# Patient Record
Sex: Female | Born: 2000
Health system: Southern US, Community
[De-identification: ages and names within clinical notes are randomized; demographics above are authoritative.]

## PROBLEM LIST (undated history)

## (undated) ENCOUNTER — Inpatient Hospital Stay (HOSPITAL_COMMUNITY): Payer: Self-pay

## (undated) HISTORY — PX: WISDOM TOOTH EXTRACTION: SHX21

---
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
## 2020-03-30 ENCOUNTER — Other Ambulatory Visit: Payer: Self-pay

## 2020-03-30 DIAGNOSIS — Z30432 Encounter for removal of intrauterine contraceptive device: Secondary | ICD-10-CM | POA: Diagnosis not present

## 2020-03-31 ENCOUNTER — Ambulatory Visit: Payer: BC Managed Care – PPO | Admitting: Nurse Practitioner

## 2020-03-31 ENCOUNTER — Encounter: Payer: Self-pay | Admitting: Nurse Practitioner

## 2020-03-31 VITALS — BP 100/64 | HR 99 | Temp 97.9°F | Ht 68.0 in | Wt 130.0 lb

## 2020-03-31 DIAGNOSIS — M545 Low back pain, unspecified: Secondary | ICD-10-CM | POA: Diagnosis not present

## 2020-03-31 MED ORDER — NAPROXEN 500 MG PO TABS: 500.0000 mg | ORAL_TABLET | Freq: Two times a day (BID) | ORAL | 0 refills | Status: DC

## 2020-03-31 MED ORDER — CYCLOBENZAPRINE HCL 5 MG PO TABS: 5.0000 mg | ORAL_TABLET | Freq: Every day | ORAL | 0 refills | Status: DC

## 2020-03-31 NOTE — Progress Notes (Signed)
Subjective:  Patient ID: Katrina Gibbs, female    DOB: Dec 30, 2000  Age: 19 y.o. MRN: [id number]  CC: Acute Visit (Pt c/o low back pain x1 month but worse in the past week. Pt states when it is hurting bad the only way to relieve it is bending over. )  Back Pain This is a new problem. The current episode started more than 1 month ago. The problem occurs constantly. The problem is unchanged. The pain is present in the lumbar spine and gluteal. The quality of the pain is described as aching and cramping. The pain does not radiate. The pain is worse during the night. The symptoms are aggravated by lying down and twisting. Pertinent negatives include no abdominal pain, bladder incontinence, bowel incontinence, dysuria, fever, leg pain, numbness, paresis, paresthesias, pelvic pain, perianal numbness, tingling, weakness or weight loss. Risk factors include poor posture. She has tried nothing for the symptoms.   Reviewed past Medical, Social and Family history today.  Outpatient Medications Prior to Visit  Medication Sig Dispense Refill  . clindamycin (CLINDAGEL) 1 % gel   2  . spironolactone (ALDACTONE) 25 MG tablet Take 25 mg by mouth daily.    Marland Kitchen tretinoin (RETIN-A) 0.025 % cream   2  . tretinoin (RETIN-A) 0.05 % cream Apply topically.     No facility-administered medications prior to visit.    ROS See HPI  Objective:  BP 100/64 (BP Location: Left Arm, Patient Position: Sitting, Cuff Size: Normal)   Pulse 99   Temp 97.9 F (36.6 C) (Temporal)   Ht 5\' 8"  (1.727 m)   Wt 130 lb (59 kg)   SpO2 100%   BMI 19.77 kg/m   Physical Exam Musculoskeletal:     Thoracic back: Normal.     Lumbar back: Spasms present. No swelling, edema, deformity, tenderness or bony tenderness. Normal range of motion. Negative right straight leg raise test and negative left straight leg raise test.       Back:     Right hip: Normal.     Right upper leg: Normal.     Right lower leg: No edema.     Left lower  leg: No edema.  Skin:    General: Skin is warm and dry.     Findings: No erythema or rash.  Neurological:     Mental Status: She is alert and oriented to person, place, and time.     Assessment & Plan:  This visit occurred during the SARS-CoV-2 public health emergency.  Safety protocols were in place, including screening questions prior to the visit, additional usage of staff PPE, and extensive cleaning of exam room while observing appropriate contact time as indicated for disinfecting solutions.   Palyn was seen today for acute visit.  Diagnoses and all orders for this visit:  Acute right-sided low back pain without sciatica -     naproxen (NAPROSYN) 500 MG tablet; Take 1 tablet (500 mg total) by mouth 2 (two) times daily with a meal. -     cyclobenzaprine (FLEXERIL) 5 MG tablet; Take 1 tablet (5 mg total) by mouth at bedtime.  Alternate between warm (before exercise) and cold compress (after exercise) Perform back exercise once a day x 2week. Call office if no improvement in 2weeks  Problem List Items Addressed This Visit    None    Visit Diagnoses    Acute right-sided low back pain without sciatica    -  Primary   Relevant Medications   naproxen (  NAPROSYN) 500 MG tablet   cyclobenzaprine (FLEXERIL) 5 MG tablet      Follow-up: No follow-ups on file.  Alysia Penna, NP

## 2020-03-31 NOTE — Patient Instructions (Signed)
Alternate between warm (before exercise) and cold compress (after exercise) Perform back exercise once a day x 2week. Call office if no improvement in 2weeks  Back Exercises The following exercises strengthen the muscles that help to support the trunk and back. They also help to keep the lower back flexible. Doing these exercises can help to prevent back pain or lessen existing pain.  If you have back pain or discomfort, try doing these exercises 2-3 times each day or as told by your health care provider.  As your pain improves, do them once each day, but increase the number of times that you repeat the steps for each exercise (do more repetitions).  To prevent the recurrence of back pain, continue to do these exercises once each day or as told by your health care provider. Do exercises exactly as told by your health care provider and adjust them as directed. It is normal to feel mild stretching, pulling, tightness, or discomfort as you do these exercises, but you should stop right away if you feel sudden pain or your pain gets worse. Exercises Single knee to chest Repeat these steps 3-5 times for each leg: 1. Lie on your back on a firm bed or the floor with your legs extended. 2. Bring one knee to your chest. Your other leg should stay extended and in contact with the floor. 3. Hold your knee in place by grabbing your knee or thigh with both hands and hold. 4. Pull on your knee until you feel a gentle stretch in your lower back or buttocks. 5. Hold the stretch for 10-30 seconds. 6. Slowly release and straighten your leg. Pelvic tilt Repeat these steps 5-10 times: 1. Lie on your back on a firm bed or the floor with your legs extended. 2. Bend your knees so they are pointing toward the ceiling and your feet are flat on the floor. 3. Tighten your lower abdominal muscles to press your lower back against the floor. This motion will tilt your pelvis so your tailbone points up toward the ceiling  instead of pointing to your feet or the floor. 4. With gentle tension and even breathing, hold this position for 5-10 seconds. Cat-cow Repeat these steps until your lower back becomes more flexible: 1. Get into a hands-and-knees position on a firm surface. Keep your hands under your shoulders, and keep your knees under your hips. You may place padding under your knees for comfort. 2. Let your head hang down toward your chest. Contract your abdominal muscles and point your tailbone toward the floor so your lower back becomes rounded like the back of a cat. 3. Hold this position for 5 seconds. 4. Slowly lift your head, let your abdominal muscles relax and point your tailbone up toward the ceiling so your back forms a sagging arch like the back of a cow. 5. Hold this position for 5 seconds.  Press-ups Repeat these steps 5-10 times: 1. Lie on your abdomen (face-down) on the floor. 2. Place your palms near your head, about shoulder-width apart. 3. Keeping your back as relaxed as possible and keeping your hips on the floor, slowly straighten your arms to raise the top half of your body and lift your shoulders. Do not use your back muscles to raise your upper torso. You may adjust the placement of your hands to make yourself more comfortable. 4. Hold this position for 5 seconds while you keep your back relaxed. 5. Slowly return to lying flat on the floor.  Katrina Gibbs  Repeat these steps 10 times: 1. Lie on your back on a firm surface. 2. Bend your knees so they are pointing toward the ceiling and your feet are flat on the floor. Your arms should be flat at your sides, next to your body. 3. Tighten your buttocks muscles and lift your buttocks off the floor until your waist is at almost the same height as your knees. You should feel the muscles working in your buttocks and the back of your thighs. If you do not feel these muscles, slide your feet 1-2 inches farther away from your buttocks. 4. Hold this  position for 3-5 seconds. 5. Slowly lower your hips to the starting position, and allow your buttocks muscles to relax completely. If this exercise is too easy, try doing it with your arms crossed over your chest. Abdominal crunches Repeat these steps 5-10 times: 1. Lie on your back on a firm bed or the floor with your legs extended. 2. Bend your knees so they are pointing toward the ceiling and your feet are flat on the floor. 3. Cross your arms over your chest. 4. Tip your chin slightly toward your chest without bending your neck. 5. Tighten your abdominal muscles and slowly raise your trunk (torso) high enough to lift your shoulder blades a tiny bit off the floor. Avoid raising your torso higher than that because it can put too much stress on your low back and does not help to strengthen your abdominal muscles. 6. Slowly return to your starting position. Back lifts Repeat these steps 5-10 times: 1. Lie on your abdomen (face-down) with your arms at your sides, and rest your forehead on the floor. 2. Tighten the muscles in your legs and your buttocks. 3. Slowly lift your chest off the floor while you keep your hips pressed to the floor. Keep the back of your head in line with the curve in your back. Your eyes should be looking at the floor. 4. Hold this position for 3-5 seconds. 5. Slowly return to your starting position. Contact a health care provider if:  Your back pain or discomfort gets much worse when you do an exercise.  Your worsening back pain or discomfort does not lessen within 2 hours after you exercise. If you have any of these problems, stop doing these exercises right away. Do not do them again unless your health care provider says that you can. Get help right away if:  You develop sudden, severe back pain. If this happens, stop doing the exercises right away. Do not do them again unless your health care provider says that you can. This information is not intended to replace  advice given to you by your health care provider. Make sure you discuss any questions you have with your health care provider. Document Revised: 09/19/2018 Document Reviewed: 02/14/2018 Elsevier Patient Education  2020 [employer].

## 2020-04-11 ENCOUNTER — Other Ambulatory Visit: Payer: Self-pay | Admitting: Nurse Practitioner

## 2020-04-11 DIAGNOSIS — M545 Low back pain, unspecified: Secondary | ICD-10-CM

## 2020-04-14 DIAGNOSIS — L700 Acne vulgaris: Secondary | ICD-10-CM | POA: Diagnosis not present

## 2020-04-25 ENCOUNTER — Other Ambulatory Visit: Payer: Self-pay | Admitting: Nurse Practitioner

## 2020-04-25 DIAGNOSIS — M545 Low back pain, unspecified: Secondary | ICD-10-CM

## 2020-04-26 NOTE — Telephone Encounter (Signed)
LVM for patient to return call in regards to medication request.

## 2020-04-26 NOTE — Telephone Encounter (Signed)
Inquire if she is still having back pain? Did she request for naproxen refill?

## 2020-04-27 NOTE — Telephone Encounter (Signed)
Pharmacy states pt requested medication refill through their app. Still unable to reach patient by phone

## 2020-05-04 ENCOUNTER — Encounter (INDEPENDENT_AMBULATORY_CARE_PROVIDER_SITE_OTHER): Payer: Self-pay | Admitting: Student in an Organized Health Care Education/Training Program

## 2020-06-25 ENCOUNTER — Encounter: Payer: Self-pay | Admitting: Nurse Practitioner

## 2020-06-25 ENCOUNTER — Telehealth (INDEPENDENT_AMBULATORY_CARE_PROVIDER_SITE_OTHER): Payer: BC Managed Care – PPO | Admitting: Nurse Practitioner

## 2020-06-25 DIAGNOSIS — F3342 Major depressive disorder, recurrent, in full remission: Secondary | ICD-10-CM | POA: Diagnosis not present

## 2020-06-25 NOTE — Assessment & Plan Note (Signed)
Stable mood without need for counseling sessions and medication.

## 2020-06-25 NOTE — Progress Notes (Signed)
Virtual Visit via Video Note  I connected with@ on 06/25/20 at 10:15 AM EST by a video enabled telemedicine application and verified that I am speaking with the correct person using two identifiers.  Location: Patient:Home Provider: Office Participants: patient and provider  I discussed the limitations of evaluation and management by telemedicine and the availability of in person appointments. I also discussed with the patient that there may be a patient responsible charge related to this service. The patient expressed understanding and agreed to proceed.  [id number] and depression f/up  History of Present Illness: Depression, major, single episode, complete remission (HCC) Stable mood without need for counseling sessions and medication.  Depression screen [facility] 2/9 06/25/2020 12/23/2019 11/18/2019  Decreased Interest 0 0 1  Down, Depressed, Hopeless 0 0 2  PHQ - 2 Score 0 0 3  Altered sleeping 1 0 1  Tired, decreased energy 1 0 2  Change in appetite 0 1 3  Feeling bad or failure about yourself  0 0 3  Trouble concentrating 0 1 1  Moving slowly or fidgety/restless 0 0 0  Suicidal thoughts 0 0 0  PHQ-9 Score 2 2 13   Difficult doing work/chores Not difficult at all Not difficult at all Very difficult   GAD 7 : Generalized Anxiety Score 06/25/2020 12/23/2019 11/18/2019 09/04/2019  Nervous, Anxious, on Edge 0 2 2 1   Control/stop worrying 0 0 3 1  Worry too much - different things 0 1 3 1   Trouble relaxing 0 0 3 1  Restless 0 0 0 0  Easily annoyed or irritable 1 1 2 1   Afraid - awful might happen 0 0 1 0  Total GAD 7 Score 1 4 14 5   Anxiety Difficulty Not difficult at all Not difficult at all Very difficult -   Observations/Objective: Physical Exam Vitals reviewed.  Psychiatric:        Attention and Perception: Attention normal.        Mood and Affect: Mood normal.        Speech: Speech normal.        Behavior: Behavior is cooperative.        Thought Content: Thought content  normal.        Cognition and Memory: Cognition normal.        Judgment: Judgment normal.    Assessment and Plan: Nicosha was seen today for follow-up.  Diagnoses and all orders for this visit:  Recurrent major depressive disorder, in full remission (HCC)   Follow Up Instructions: F/up in 78months for CPE   I discussed the assessment and treatment plan with the patient. The patient was provided an opportunity to ask questions and all were answered. The patient agreed with the plan and demonstrated an understanding of the instructions.   The patient was advised to call back or seek an in-person evaluation if the symptoms worsen or if the condition fails to improve as anticipated.  , NP

## 2020-08-19 ENCOUNTER — Ambulatory Visit (INDEPENDENT_AMBULATORY_CARE_PROVIDER_SITE_OTHER): Payer: BC Managed Care – PPO | Admitting: Sports Medicine

## 2020-08-19 ENCOUNTER — Encounter: Payer: Self-pay | Admitting: Sports Medicine

## 2020-08-19 ENCOUNTER — Other Ambulatory Visit: Payer: Self-pay

## 2020-08-19 DIAGNOSIS — L603 Nail dystrophy: Secondary | ICD-10-CM

## 2020-08-19 DIAGNOSIS — M79674 Pain in right toe(s): Secondary | ICD-10-CM

## 2020-08-19 NOTE — Progress Notes (Signed)
  Subjective: Katrina Gibbs is a 20 y.o. female patient seen today in office for evaluation of right great toenail. Reports that she dropped a table on the toe back in November and has severe bruising denies any pain but does state that there is some discoloration of her nail.  Review of system noncontributory  Patient Active Problem List   Diagnosis Date Noted  . Depression, major, single episode, complete remission (HCC) 09/04/2019  . Preventative health care 02/07/2016  . Migraines 11/03/2015    Current Outpatient Medications on File Prior to Visit  Medication Sig Dispense Refill  . azithromycin (ZITHROMAX) 250 MG tablet Take 250 mg by mouth daily.    . clindamycin (CLINDAGEL) 1 % gel   2  . spironolactone (ALDACTONE) 25 MG tablet Take 25 mg by mouth daily.    Marland Kitchen tretinoin (RETIN-A) 0.025 % cream   2  . tretinoin (RETIN-A) 0.1 % cream Apply topically at bedtime.     No current facility-administered medications on file prior to visit.    No Known Allergies  Objective: Physical Exam  General: Well developed, nourished, no acute distress, awake, alert and oriented x 3  Vascular: Dorsalis pedis artery 2/4 bilateral, Posterior tibial artery 2/4 bilateral, skin temperature warm to warm proximal to distal bilateral lower extremities, no varicosities, pedal hair present bilateral.  Neurological: Gross sensation present via light touch bilateral.   Dermatological: Skin is warm, dry, and supple bilateral, right hallux nail still attached with blood at the proximal nail fold there is no significant surrounding redness warmth swelling or active drainage nail appears to be still well attached with no acute lifting but did advise patient that as her nail continues to grow out the nail may lift and fall off due to the trauma at the proximal nail fold  Musculoskeletal: No symptomatic boney deformities noted bilateral. Muscular strength within normal limits without painon range of motion. No  pain with calf compression bilateral.  Assessment and Plan:  Problem List Items Addressed This Visit   None   Visit Diagnoses    Nail dystrophy    -  Primary   Toe pain, right          -Examined patient.  -Discussed treatment options for nail dystrophy -Advised patient since nail is still well attached to allow dry blood to slowly grow out -Advised patient to closely monitor at this time no acute procedure or x-rays are indicated since patient has no pain to the toe or toenail -Patient to return if fails to continue to improve or sooner if symptoms worsen.  Asencion Islam, DPM

## 2020-10-13 DIAGNOSIS — Z01419 Encounter for gynecological examination (general) (routine) without abnormal findings: Secondary | ICD-10-CM | POA: Diagnosis not present

## 2020-10-13 DIAGNOSIS — Z6820 Body mass index (BMI) 20.0-20.9, adult: Secondary | ICD-10-CM | POA: Diagnosis not present

## 2020-10-13 DIAGNOSIS — Z113 Encounter for screening for infections with a predominantly sexual mode of transmission: Secondary | ICD-10-CM | POA: Diagnosis not present

## 2020-11-16 ENCOUNTER — Encounter: Payer: BC Managed Care – PPO | Admitting: Nurse Practitioner

## 2020-12-23 ENCOUNTER — Other Ambulatory Visit: Payer: Self-pay

## 2020-12-24 ENCOUNTER — Encounter: Payer: Self-pay | Admitting: Nurse Practitioner

## 2020-12-24 ENCOUNTER — Encounter: Payer: BC Managed Care – PPO | Admitting: Nurse Practitioner

## 2020-12-24 ENCOUNTER — Ambulatory Visit (INDEPENDENT_AMBULATORY_CARE_PROVIDER_SITE_OTHER): Payer: BC Managed Care – PPO | Admitting: Nurse Practitioner

## 2020-12-24 VITALS — BP 110/68 | HR 94 | Temp 98.1°F | Ht 68.25 in | Wt 135.2 lb

## 2020-12-24 DIAGNOSIS — Z0184 Encounter for antibody response examination: Secondary | ICD-10-CM

## 2020-12-24 DIAGNOSIS — Z111 Encounter for screening for respiratory tuberculosis: Secondary | ICD-10-CM

## 2020-12-24 DIAGNOSIS — Z23 Encounter for immunization: Secondary | ICD-10-CM

## 2020-12-24 DIAGNOSIS — F324 Major depressive disorder, single episode, in partial remission: Secondary | ICD-10-CM

## 2020-12-24 DIAGNOSIS — Z0000 Encounter for general adult medical examination without abnormal findings: Secondary | ICD-10-CM | POA: Diagnosis not present

## 2020-12-24 LAB — COMPREHENSIVE METABOLIC PANEL
ALT: 11 U/L (ref 0–35)
AST: 15 U/L (ref 0–37)
Albumin: 4.8 g/dL (ref 3.5–5.2)
Alkaline Phosphatase: 51 U/L (ref 39–117)
BUN: 12 mg/dL (ref 6–23)
CO2: 26 mEq/L (ref 19–32)
Calcium: 10 mg/dL (ref 8.4–10.5)
Chloride: 102 mEq/L (ref 96–112)
Creatinine, Ser: 0.75 mg/dL (ref 0.40–1.20)
GFR: 114.82 mL/min (ref >60.00)
Glucose, Bld: 77 mg/dL (ref 70–99)
Potassium: 4.4 mEq/L (ref 3.5–5.1)
Sodium: 137 mEq/L (ref 135–145)
Total Bilirubin: 1 mg/dL (ref 0.2–1.2)
Total Protein: 7.2 g/dL (ref 6.0–8.3)

## 2020-12-24 LAB — CBC
HCT: 43.2 % (ref 36.0–46.0)
Hemoglobin: 14.7 g/dL (ref 12.0–15.0)
MCHC: 33.9 g/dL (ref 30.0–36.0)
MCV: 91.4 fl (ref 78.0–100.0)
Platelets: 403 10*3/uL — ABNORMAL HIGH (ref 150.0–400.0)
RBC: 4.73 Mil/uL (ref 3.87–5.11)
RDW: 13 % (ref 11.5–14.6)
WBC: 10.1 10*3/uL (ref 4.5–10.5)

## 2020-12-24 NOTE — Patient Instructions (Signed)
Go to lab for blood draw  School form completed  Preventive Care 20-20 Years Old, Female Preventive care refers to lifestyle choices and visits with your health care provider that can promote health and wellness. At this stage in your life, you may start seeing a primary care physician instead of a pediatrician. It is important to take responsibility for your health and well-being. Preventive care for young adults includes: A yearly physical exam. This is also called an annual wellness visit. Regular dental and eye exams. Immunizations. Screening for certain conditions. Healthy lifestyle choices, such as: Eating a healthy diet. Getting regular exercise. Not using drugs or products that contain nicotine and tobacco. Limiting alcohol use. What can I expect for my preventive care visit? Physical exam Your health care provider may check your: Height and weight. These may be used to calculate your BMI (body mass index). BMI is a measurement that tells if you are at a healthy weight. Heart rate and blood pressure. Body temperature. Skin for abnormal spots. Counseling Your health care provider may ask you questions about your: Past medical problems. Family's medical history. Alcohol, tobacco, and drug use. Home life and relationship well-being. Access to firearms. Emotional well-being. Diet, exercise, and sleep habits. Sexual activity and sexual health. Method of birth control. Menstrual cycle. Pregnancy history. What immunizations do I need?  Vaccines are usually given at various ages, according to a schedule. Your health care provider will recommend vaccines for you based on your age, medicalhistory, and lifestyle or other factors, such as travel or where you work. What tests do I need? Blood tests Lipid and cholesterol levels. These may be checked every 5 years starting at age 20. Hepatitis C test. Hepatitis B test. Screening Pelvic exam and Pap test. This may be done every 3  years starting at age 20. STD (sexually transmitted disease) testing, if you are at risk. BRCA-related cancer screening. This may be done if you have a family history of breast, ovarian, tubal, or peritoneal cancers. Other tests Tuberculosis skin test. Vision and hearing tests. Skin exam. Breast exam. Talk with your health care provider about your test results, treatment options,and if necessary, the need for more tests. Follow these instructions at home: Eating and drinking  Eat a healthy diet that includes fresh fruits and vegetables, whole grains, lean protein, and low-fat dairy products. Drink enough fluid to keep your urine pale yellow. Do not drink alcohol if: Your health care provider tells you not to drink. You are pregnant, may be pregnant, or are planning to become pregnant. You are under the legal drinking age. In the [place] the legal drinking age is 20. If you drink alcohol: Limit how much you use to 0-1 drink a day. Be aware of how much alcohol is in your drink. In the [place] one drink equals one 12 oz bottle of beer (355 mL), one 5 oz glass of wine (148 mL), or one 1 oz glass of hard liquor (44 mL).  Lifestyle Take daily care of your teeth and gums. Brush your teeth every morning and night with fluoride toothpaste. Floss one time each day. Stay active. Exercise for at least 30 minutes 5 or more days of the week. Do not use any products that contain nicotine or tobacco, such as cigarettes, e-cigarettes, and chewing tobacco. If you need help quitting, ask your health care provider. Do not use drugs. If you are sexually active, practice safe sex. Use a condom or other form of protection to prevent STIs (sexually  transmitted infections). If you do not wish to become pregnant, use a form of birth control. If you plan to become pregnant, see your health care provider for a prepregnancy visit. Find healthy ways to cope with stress, such as: Meditation, yoga, or listening to  music. Journaling. Talking to a trusted person. Spending time with friends and family. Safety Always wear your seat belt while driving or riding in a vehicle. Do not drive: If you have been drinking alcohol. Do not ride with someone who has been drinking. When you are tired or distracted. While texting. Wear a helmet and other protective equipment during sports activities. If you have firearms in your house, make sure you follow all gun safety procedures. Seek help if you have been bullied, physically abused, or sexually abused. Use the Internet responsibly to avoid dangers, such as online bullying and online sex predators. What's next? Go to your health care provider once a year for an annual wellness visit. Ask your health care provider how often you should have your eyes and teeth checked. Stay up to date on all vaccines. This information is not intended to replace advice given to you by your health care provider. Make sure you discuss any questions you have with your healthcare provider. Document Revised: 01/11/2020 Document Reviewed: 05/09/2018 Elsevier Patient Education  2022 [employer].

## 2020-12-24 NOTE — Progress Notes (Signed)
Subjective:    Patient ID: Katrina Gibbs, female    DOB: Aug 17, 2000, 20 y.o.   MRN: [id number]  Patient presents today for CPE and completion of school form  HPI Depression, major, single episode, in partial remission (HCC) Stable mood. Denies need for CBT and/or medication  She need quantiferon test (denies any respiratory symptoms), Hep. B titer (had 3doses of vaccine) and varicella vaccine. 1st varicella vaccine administered 11/01/2001.  Vision:up to date, use of contact lens Dental:up to date Diet:regular Exercise:none Weight:  Wt Readings from Last 3 Encounters:  12/24/20 135 lb 3.2 oz (61.3 kg)  03/31/20 130 lb (59 kg) (55 %, Z= 0.12)*  12/23/19 131 lb 9.6 oz (59.7 kg) (59 %, Z= 0.22)*   * Growth percentiles are based on CDC (Girls, 2-20 Years) data.    Sexual History (orientation,birth control, marital status, STD):declined HPV vaccine and STD screen (education provided about its importance), breast exam completed by GYN. Advised about need for monthly self breast exam  Depression/Suicide:stable mood, denies need for medication and/or therapy sessions. Depression screen [facility] 2/9 12/24/2020 06/25/2020 12/23/2019 11/18/2019 09/04/2019 07/15/2019 12/18/2017  Decreased Interest 0 0 0 1 0 1 0  Down, Depressed, Hopeless 0 0 0 2 0 2 0  PHQ - 2 Score 0 0 0 3 0 3 0  Altered sleeping 2 1 0 1 1 3  -  Tired, decreased energy 2 1 0 2 1 2  -  Change in appetite 2 0 1 3 1 3  -  Feeling bad or failure about yourself  0 0 0 3 0 2 -  Trouble concentrating 1 0 1 1 2 3  -  Moving slowly or fidgety/restless 0 0 0 0 0 0 -  Suicidal thoughts 0 0 0 0 0 2 -  PHQ-9 Score 7 2 2 13 5 18  -  Difficult doing work/chores Somewhat difficult Not difficult at all Not difficult at all Very difficult - - -   Immunizations: (TDAP, Hep C screen, Pneumovax, Influenza, zoster)  Health Maintenance  Topic Date Due   Chlamydia screening  12/24/2021*   HPV Vaccine (1 - 2-dose series) 12/24/2021*   Hepatitis C  Screening: USPSTF Recommendation to screen - Ages 18-79 yo.  12/24/2021*   HIV Screening  12/24/2021*   Flu Shot  12/27/2020   Tetanus Vaccine  12/04/2021   Pneumococcal Vaccination  Aged Out  *Topic was postponed. The date shown is not the original due date.   Fall Risk: Fall Risk  06/25/2020 09/04/2019 07/15/2019 12/18/2017  Falls in the past year? 0 0 0 No  Number falls in past yr: 0 0 - -  Injury with Fall? 0 0 - -   Advanced Directive: Advanced Directives 12/18/2017  Does Patient Have a Medical Advance Directive? No    Medications and allergies reviewed with patient and updated if appropriate.  Patient Active Problem List   Diagnosis Date Noted   Depression, major, single episode, in partial remission (HCC) 09/04/2019   Preventative health care 02/07/2016   Migraines 11/03/2015   Current Outpatient Medications on File Prior to Visit  Medication Sig Dispense Refill   clindamycin (CLINDAGEL) 1 % gel   2   spironolactone (ALDACTONE) 25 MG tablet Take 25 mg by mouth daily.     tretinoin (RETIN-A) 0.1 % cream Apply topically at bedtime.     No current facility-administered medications on file prior to visit.    Past Medical History:  Diagnosis Date   Constipation    Past Surgical  History:  Procedure Laterality Date   WISDOM TOOTH EXTRACTION     Social History   Socioeconomic History   Marital status: Single    Spouse name: Not on file   Number of children: 0   Years of education: 9   Highest education level: Not on file  Occupational History   Occupation: Student  Tobacco Use   Smoking status: Never   Smokeless tobacco: Never  Vaping Use   Vaping Use: Former   Start date: 12/22/2016   Substances: Nicotine  Substance and Sexual Activity   Alcohol use: No   Drug use: No   Sexual activity: Yes    Birth control/protection: I.U.D.  Other Topics Concern   Not on file  Social History Narrative   12th grade taking online classes   Social Determinants of Health    Financial Resource Strain: Not on file  Food Insecurity: Not on file  Transportation Needs: Not on file  Physical Activity: Not on file  Stress: Not on file  Social Connections: Not on file   Family History  Problem Relation Age of Onset   Hyperlipidemia Father    Hypertension Father    Heart disease Maternal Grandmother    Hypertension Maternal Grandmother    Diabetes Maternal Grandmother    Stroke Maternal Grandfather    Hyperlipidemia Paternal Grandmother    Stroke Paternal Grandfather    Hypertension Paternal Grandfather    Irritable bowel syndrome Neg Hx    Celiac disease Neg Hx    Colitis Neg Hx    Crohn's disease Neg Hx    GER disease Neg Hx        Review of Systems  Constitutional:  Negative for fever, malaise/fatigue and weight loss.  HENT:  Negative for congestion and sore throat.   Eyes:        Negative for visual changes  Respiratory:  Negative for cough and shortness of breath.   Cardiovascular:  Negative for chest pain, palpitations and leg swelling.  Gastrointestinal:  Negative for blood in stool, constipation, diarrhea and heartburn.  Genitourinary:  Negative for dysuria, frequency and urgency.  Musculoskeletal:  Negative for falls, joint pain and myalgias.  Skin:  Negative for rash.  Neurological:  Negative for dizziness, sensory change and headaches.  Endo/Heme/Allergies:  Does not bruise/bleed easily.  Psychiatric/Behavioral:  Positive for depression. Negative for hallucinations, memory loss, substance abuse and suicidal ideas. The patient is nervous/anxious. The patient does not have insomnia.    Objective:   Vitals:   12/24/20 1023  BP: 110/68  Pulse: 94  Temp: 98.1 F (36.7 C)  SpO2: 98%   Body mass index is 20.41 kg/m.  Physical Examination:  Physical Exam Constitutional:      General: She is not in acute distress. HENT:     Right Ear: Tympanic membrane, ear canal and external ear normal.     Left Ear: Tympanic membrane, ear  canal and external ear normal.  Eyes:     General: No scleral icterus.    Extraocular Movements: Extraocular movements intact.     Conjunctiva/sclera: Conjunctivae normal.  Cardiovascular:     Rate and Rhythm: Normal rate and regular rhythm.     Heart sounds: Normal heart sounds.  Pulmonary:     Effort: Pulmonary effort is normal. No respiratory distress.     Breath sounds: Normal breath sounds.  Abdominal:     General: Bowel sounds are normal. There is no distension.     Palpations: Abdomen is soft.  Genitourinary:    Comments: Deferred breast and pelvic exam to GYN Musculoskeletal:        General: Normal range of motion.     Cervical back: Normal range of motion and neck supple.  Lymphadenopathy:     Cervical: No cervical adenopathy.  Skin:    General: Skin is warm and dry.  Neurological:     Mental Status: She is alert and oriented to person, place, and time.  Psychiatric:        Mood and Affect: Mood normal.        Behavior: Behavior normal.        Thought Content: Thought content normal.   ASSESSMENT and PLAN: This visit occurred during the SARS-CoV-2 public health emergency.  Safety protocols were in place, including screening questions prior to the visit, additional usage of staff PPE, and extensive cleaning of exam room while observing appropriate contact time as indicated for disinfecting solutions.   Katrina Gibbs was seen today for annual exam.  Diagnoses and all orders for this visit:  Preventative health care -     CBC -     Comprehensive metabolic panel  Immunity to hepatitis B virus demonstrated by serologic test -     Hepatitis B surface antibody,quantitative  Screening-pulmonary TB -     QuantiFERON-TB Gold Plus  Need for varicella vaccine -     Varicella vaccine subcutaneous  Major depressive disorder with single episode, in partial remission (HCC)     Problem List Items Addressed This Visit       Other   Preventative health care - Primary    Relevant Orders   CBC   Comprehensive metabolic panel   Other Visit Diagnoses     Immunity to hepatitis B virus demonstrated by serologic test       Relevant Orders   Hepatitis B surface antibody,quantitative   Screening-pulmonary TB       Relevant Orders   QuantiFERON-TB Gold Plus   Need for varicella vaccine       Relevant Orders   Varicella vaccine subcutaneous (Completed)   Major depressive disorder with single episode, in partial remission (HCC)   (Chronic)          Follow up: No follow-ups on file.  Alysia Penna, NP

## 2020-12-24 NOTE — Assessment & Plan Note (Signed)
Stable mood. Denies need for CBT and/or medication

## 2020-12-28 LAB — QUANTIFERON-TB GOLD PLUS
Mitogen-NIL: >10 IU/mL
NIL: 0.03 IU/mL
QuantiFERON-TB Gold Plus: NEGATIVE
TB1-NIL: <0 IU/mL
TB2-NIL: 0.01 IU/mL

## 2020-12-28 LAB — HEPATITIS B SURFACE ANTIBODY, QUANTITATIVE: Hep B S AB Quant (Post): <5 m[IU]/mL — ABNORMAL LOW (ref >=10)

## 2021-01-12 ENCOUNTER — Other Ambulatory Visit: Payer: Self-pay

## 2021-01-12 ENCOUNTER — Ambulatory Visit (INDEPENDENT_AMBULATORY_CARE_PROVIDER_SITE_OTHER): Payer: BC Managed Care – PPO

## 2021-01-12 DIAGNOSIS — Z9229 Personal history of other drug therapy: Secondary | ICD-10-CM | POA: Diagnosis not present

## 2021-01-12 DIAGNOSIS — Z23 Encounter for immunization: Secondary | ICD-10-CM | POA: Diagnosis not present

## 2021-01-12 NOTE — Progress Notes (Signed)
Per the orders of NP Alysia Penna pt is here for Hep B vaccine first dose of three dose series. Pt is to return in one month for next dose of Hep B

## 2021-02-16 ENCOUNTER — Other Ambulatory Visit: Payer: Self-pay

## 2021-02-16 ENCOUNTER — Ambulatory Visit (INDEPENDENT_AMBULATORY_CARE_PROVIDER_SITE_OTHER): Payer: BC Managed Care – PPO

## 2021-02-16 DIAGNOSIS — Z23 Encounter for immunization: Secondary | ICD-10-CM | POA: Diagnosis not present

## 2021-02-16 DIAGNOSIS — Z0000 Encounter for general adult medical examination without abnormal findings: Secondary | ICD-10-CM

## 2021-02-16 NOTE — Progress Notes (Signed)
After obtaining consent, and per orders of [facility], injection of Hep B #2 given by [place]. Patient instructed to remain in clinic for 20 minutes afterwards, and to report any adverse reaction to me immediately. Also aware to return for 3rd does on or after February 17th 2023.

## 2021-06-10 DIAGNOSIS — J029 Acute pharyngitis, unspecified: Secondary | ICD-10-CM | POA: Diagnosis not present

## 2021-06-10 DIAGNOSIS — J069 Acute upper respiratory infection, unspecified: Secondary | ICD-10-CM | POA: Diagnosis not present

## 2021-07-19 ENCOUNTER — Ambulatory Visit: Payer: BC Managed Care – PPO

## 2021-07-21 ENCOUNTER — Other Ambulatory Visit: Payer: Self-pay

## 2021-07-21 ENCOUNTER — Ambulatory Visit (INDEPENDENT_AMBULATORY_CARE_PROVIDER_SITE_OTHER): Payer: BC Managed Care – PPO

## 2021-07-21 DIAGNOSIS — Z9229 Personal history of other drug therapy: Secondary | ICD-10-CM

## 2021-07-21 DIAGNOSIS — Z23 Encounter for immunization: Secondary | ICD-10-CM

## 2021-07-21 NOTE — Progress Notes (Signed)
Per orders of Alysia Penna, NP, injection of 3rd Hep B vaccine given by Mickle Plumb, cma.  Patient tolerated injection well.

## 2021-11-11 DIAGNOSIS — Z1151 Encounter for screening for human papillomavirus (HPV): Secondary | ICD-10-CM | POA: Diagnosis not present

## 2021-11-11 DIAGNOSIS — Z124 Encounter for screening for malignant neoplasm of cervix: Secondary | ICD-10-CM | POA: Diagnosis not present

## 2021-11-11 DIAGNOSIS — Z01419 Encounter for gynecological examination (general) (routine) without abnormal findings: Secondary | ICD-10-CM | POA: Diagnosis not present

## 2021-11-11 DIAGNOSIS — Z6821 Body mass index (BMI) 21.0-21.9, adult: Secondary | ICD-10-CM | POA: Diagnosis not present

## 2021-11-11 DIAGNOSIS — Z113 Encounter for screening for infections with a predominantly sexual mode of transmission: Secondary | ICD-10-CM | POA: Diagnosis not present

## 2021-11-16 LAB — HM PAP SMEAR

## 2022-02-08 ENCOUNTER — Telehealth: Payer: Self-pay | Admitting: Nurse Practitioner

## 2022-02-08 NOTE — Telephone Encounter (Signed)
Immunization forms have been faxed to number provided

## 2022-02-08 NOTE — Telephone Encounter (Signed)
Pt [phone]  Pt would like her immunization record faxed to her work. Fax # is [phone]

## 2022-04-17 ENCOUNTER — Telehealth: Payer: Self-pay | Admitting: Nurse Practitioner

## 2022-04-17 NOTE — Telephone Encounter (Signed)
FYI:Pt called in trying to schedule an OV. I asked what is going on and she has abd pain, SOB and feeling like she is going to faint. I transferred over to nurse triage.

## 2022-04-18 ENCOUNTER — Ambulatory Visit: Payer: BC Managed Care – PPO | Admitting: Family Medicine

## 2022-04-18 ENCOUNTER — Ambulatory Visit (INDEPENDENT_AMBULATORY_CARE_PROVIDER_SITE_OTHER): Payer: BC Managed Care – PPO

## 2022-04-18 ENCOUNTER — Encounter: Payer: Self-pay | Admitting: Family Medicine

## 2022-04-18 VITALS — BP 124/82 | HR 89 | Temp 97.5°F | Wt 142.4 lb

## 2022-04-18 DIAGNOSIS — R55 Syncope and collapse: Secondary | ICD-10-CM | POA: Diagnosis not present

## 2022-04-18 DIAGNOSIS — R0602 Shortness of breath: Secondary | ICD-10-CM

## 2022-04-18 DIAGNOSIS — R002 Palpitations: Secondary | ICD-10-CM

## 2022-04-18 LAB — COMPREHENSIVE METABOLIC PANEL
ALT: 10 U/L (ref 0–35)
AST: 15 U/L (ref 0–37)
Albumin: 4.7 g/dL (ref 3.5–5.2)
Alkaline Phosphatase: 41 U/L (ref 39–117)
BUN: 13 mg/dL (ref 6–23)
CO2: 29 mEq/L (ref 19–32)
Calcium: 9.5 mg/dL (ref 8.4–10.5)
Chloride: 102 mEq/L (ref 96–112)
Creatinine, Ser: 0.77 mg/dL (ref 0.40–1.20)
GFR: 110.23 mL/min (ref >60.00)
Glucose, Bld: 85 mg/dL (ref 70–99)
Potassium: 4.8 mEq/L (ref 3.5–5.1)
Sodium: 136 mEq/L (ref 135–145)
Total Bilirubin: 0.7 mg/dL (ref 0.2–1.2)
Total Protein: 7.1 g/dL (ref 6.0–8.3)

## 2022-04-18 LAB — CBC WITH DIFFERENTIAL/PLATELET
Basophils Absolute: 0.1 10*3/uL (ref 0.0–0.1)
Basophils Relative: 0.6 % (ref 0.0–3.0)
Eosinophils Absolute: 0.1 10*3/uL (ref 0.0–0.7)
Eosinophils Relative: 0.6 % (ref 0.0–5.0)
HCT: 41.3 % (ref 36.0–46.0)
Hemoglobin: 14.1 g/dL (ref 12.0–15.0)
Lymphocytes Relative: 20.3 % (ref 12.0–46.0)
Lymphs Abs: 2.4 10*3/uL (ref 0.7–4.0)
MCHC: 34.1 g/dL (ref 30.0–36.0)
MCV: 92.9 fl (ref 78.0–100.0)
Monocytes Absolute: 0.4 10*3/uL (ref 0.1–1.0)
Monocytes Relative: 3.7 % (ref 3.0–12.0)
Neutro Abs: 8.7 10*3/uL — ABNORMAL HIGH (ref 1.4–7.7)
Neutrophils Relative %: 74.8 % (ref 43.0–77.0)
Platelets: 413 10*3/uL — ABNORMAL HIGH (ref 150.0–400.0)
RBC: 4.44 Mil/uL (ref 3.87–5.11)
RDW: 12.8 % (ref 11.5–15.5)
WBC: 11.6 10*3/uL — ABNORMAL HIGH (ref 4.0–10.5)

## 2022-04-18 LAB — HEMOGLOBIN A1C: Hgb A1c MFr Bld: 5.2 % (ref 4.6–6.5)

## 2022-04-18 LAB — TSH: TSH: 1.75 u[IU]/mL (ref 0.35–5.50)

## 2022-04-18 LAB — POCT URINE PREGNANCY: Preg Test, Ur: NEGATIVE

## 2022-04-18 NOTE — Patient Instructions (Signed)
We are ordering blood work, imaging, and heart monitor.  Please go to ED, if you get severe chest pain, fainting, shortness of breath, or other worrisome symptoms.

## 2022-04-18 NOTE — Progress Notes (Unsigned)
Initial visit for syncope associated with sob, palpitations and chest pain.  Pt also reports a "vicious cough" after she eats. She noticed this started after she had covid 2 years ago.

## 2022-04-18 NOTE — Progress Notes (Signed)
Assessment/Plan:   Problem List Items Addressed This Visit       Other   Syncope    Associated with dyspnea, chest pain, and palpitations Concern for possible cardiac etiology including arrhythmia or structural heart disease, other causes include anxiety, lung disease, endocrine dysfunction, electrolyte abnormalities, anemia Orthostatics are normal ECG is sinus rhythm without ST changes, or pq interval prolongation If patient is recurrence, recommend patient to emergency department, low threshold to refer to cardiology If work-up negative, patient follow-up with PCP for further evaluation       Relevant Orders   DG Chest 2 View   Comp Met (CMET)   Hemoglobin A1C   CBC w/Diff   TSH   LONG TERM MONITOR (3-14 DAYS)   ECHOCARDIOGRAM COMPLETE   POCT urine pregnancy (Completed)   Other Visit Diagnoses     Shortness of breath    -  Primary   Relevant Orders   EKG 12-Lead (Completed)   DG Chest 2 View   Comp Met (CMET)   Hemoglobin A1C   CBC w/Diff   TSH   LONG TERM MONITOR (3-14 DAYS)   ECHOCARDIOGRAM COMPLETE   POCT urine pregnancy (Completed)   Palpitations       Relevant Orders   EKG 12-Lead (Completed)   DG Chest 2 View   Comp Met (CMET)   Hemoglobin A1C   CBC w/Diff   TSH   LONG TERM MONITOR (3-14 DAYS)   ECHOCARDIOGRAM COMPLETE   POCT urine pregnancy (Completed)          Subjective:  HPI:  Katrina Gibbs is a 21 y.o. female who has Migraines; Preventative health care; Depression, major, single episode, in partial remission ([place]); and Syncope on their problem list..   She  has a past medical history of Constipation..   She presents with chief complaint of Abdominal Pain (Upper mid Abdominal pain x Saturday , sob and feeling faint x months. Passed out in July and October felt faint. ) .    Syncope/Presyncope: Patient complains of syncope and presyncope. Onset was  several years  years ago, with worsening course since that time. Patient describes  the episode as syncopal episode occurred during normal non-stressful ADLsx2 6 months 3s while walking. Presyncopal events unknown trigger and can occur when sitting. Patient also has associated symptoms of  tachycardia/palpitations. The patient denies diarrhea, excessive thirst, headache, heavy menstrual bleeding, and melena. Taking culprit meds?: diuretics  Dyspnea: Patient complains of shortness of breath at rest, with walking .  Symptoms include chest pain, located substernal area, dyspnea on exertion, and occassional cough with eating after eating after having COVID 2 years ago . Symptoms began  11 months ago  months ago, unchanged since that time.  Patient denies edema   and wheezing. Patient has not had recent travel.  Weight has been stable.  Appetite has been unchanged. Symptoms are exacerbated by  unknown trigger . Symptoms are alleviated by rest and deep breathing.   Past Surgical History:  Procedure Laterality Date   WISDOM TOOTH EXTRACTION      Outpatient Medications Prior to Visit  Medication Sig Dispense Refill   clindamycin (CLINDAGEL) 1 % gel   2   spironolactone (ALDACTONE) 25 MG tablet Take 25 mg by mouth daily.     tretinoin (RETIN-A) 0.1 % cream Apply topically at bedtime.     No facility-administered medications prior to visit.    Family History  Problem Relation Age of Onset   Hyperlipidemia Father  Hypertension Father    Heart disease Maternal Grandmother    Hypertension Maternal Grandmother    Diabetes Maternal Grandmother    Stroke Maternal Grandfather    Hyperlipidemia Paternal Grandmother    Stroke Paternal Grandfather    Hypertension Paternal Grandfather    Irritable bowel syndrome Neg Hx    Celiac disease Neg Hx    Colitis Neg Hx    Crohn's disease Neg Hx    GER disease Neg Hx     Social History   Socioeconomic History   Marital status: Single    Spouse name: Not on file   Number of children: 0   Years of education: 9   Highest education  level: Not on file  Occupational History   Occupation: Ship broker  Tobacco Use   Smoking status: Never   Smokeless tobacco: Never  Vaping Use   Vaping Use: Former   Start date: 12/22/2016   Substances: Nicotine  Substance and Sexual Activity   Alcohol use: No   Drug use: No   Sexual activity: Yes    Birth control/protection: I.U.D.  Other Topics Concern   Not on file  Social History Narrative   12th grade taking online classes   Social Determinants of Health   Financial Resource Strain: Not on file  Food Insecurity: Not on file  Transportation Needs: Not on file  Physical Activity: Not on file  Stress: Not on file  Social Connections: Not on file  Intimate Partner Violence: Not on file                                                                                                 Objective:  Physical Exam: BP 124/82 (BP Location: Left Arm, Patient Position: Sitting, Cuff Size: Large)   Pulse 89   Temp (!) 97.5 F (36.4 C) (Temporal)   Wt 142 lb 6.4 oz (64.6 kg)   LMP 03/21/2022   SpO2 98%   BMI 21.49 kg/m    Gen: NAD, resting comfortably CV: RRR with no murmurs appreciated Pulm: NWOB, CTAB with no crackles, wheezes, or rhonchi GI: Normal bowel sounds present. Soft, Nontender, Nondistended. MSK: no edema, cyanosis, or clubbing noted Skin: warm, dry Neuro: grossly normal, moves all extremities Psych: Normal affect and thought content   Orthostatic VS for the past 72 hrs (Last 3 readings):  Orthostatic BP Patient Position BP Location Cuff Size Orthostatic Pulse  04/18/22 0843 118/78 Lying left side Right Arm Large 88  04/18/22 0842 124/80 Standing Right Arm Large 110  04/18/22 0841 122/78 Sitting Right Arm Large 90        Alesia Banda, MD, MS

## 2022-04-18 NOTE — Assessment & Plan Note (Addendum)
Associated with dyspnea, chest pain, and palpitations Concern for possible cardiac etiology including arrhythmia or structural heart disease, other causes include anxiety, lung disease, endocrine dysfunction, electrolyte abnormalities, anemia Orthostatics are normal ECG is sinus rhythm without ST changes, or pq interval prolongation If patient is recurrence, recommend patient to emergency department, low threshold to refer to cardiology If work-up negative, patient follow-up with PCP for further evaluation

## 2022-05-04 ENCOUNTER — Ambulatory Visit: Payer: BC Managed Care – PPO | Attending: Family Medicine

## 2022-05-04 DIAGNOSIS — R0602 Shortness of breath: Secondary | ICD-10-CM

## 2022-05-04 DIAGNOSIS — R55 Syncope and collapse: Secondary | ICD-10-CM

## 2022-05-04 DIAGNOSIS — R002 Palpitations: Secondary | ICD-10-CM

## 2022-05-04 NOTE — Progress Notes (Unsigned)
Enrolled for [employer] to mail a ZIO XT long term holter monitor to the patients address on file.   DOD to read. 

## 2022-05-12 ENCOUNTER — Encounter: Payer: Self-pay | Admitting: Nurse Practitioner

## 2022-05-12 ENCOUNTER — Ambulatory Visit: Payer: BC Managed Care – PPO | Admitting: Nurse Practitioner

## 2022-05-12 VITALS — BP 102/62 | HR 95 | Temp 97.6°F | Ht 68.0 in | Wt 144.6 lb

## 2022-05-12 DIAGNOSIS — R55 Syncope and collapse: Secondary | ICD-10-CM

## 2022-05-12 DIAGNOSIS — R002 Palpitations: Secondary | ICD-10-CM | POA: Diagnosis not present

## 2022-05-12 DIAGNOSIS — R0602 Shortness of breath: Secondary | ICD-10-CM

## 2022-05-12 NOTE — Assessment & Plan Note (Addendum)
Onset at age 21, initially had intermittent feeling faint but not syncopal episode at that time. Associated with cold sweats, SOB, palpitation and dizziness. Unknown trigger. Occurs at random times. In July 2023, she had a syncopal episode (standing with friend talking, felt faint, turned to go sit down), LOC <52min, witnessed by her friend, no seizure activity noted, no post ictal symptoms. She did not seek medical evaluation at the time. Does not skips meals. Maintain adequate oral hydration, at least 64oz per day. No hx of anemia. No Fhx of sudden death.  Pending holter monitor  and echocardiogram report. Positive orthostatic hypotension. Advised to Use knee high compression stocking during the day and off at night. Maintain adequate oral hydration and do not limit sodium intake. Consider discontinuation of spironolactone if normal echocardiogram and holter monitor

## 2022-05-12 NOTE — Progress Notes (Signed)
Established Patient Visit  Patient: Katrina Gibbs   DOB: [birth year]   21 y.o. Female  MRN: [id number] Visit Date: 05/12/2022  Subjective:    Chief Complaint  Patient presents with   Office Visit    C/o syncope & SOB x 7 months  Requesting records for pap   HPI Syncope Onset at age 35, initially had intermittent feeling faint but not syncopal episode at that time. Associated with cold sweats, SOB, palpitation and dizziness. Unknown trigger. Occurs at random times. In July 2023, she had a syncopal episode (standing with friend talking, felt faint, turned to go sit down), LOC <56min, witnessed by her friend, no seizure activity noted, no post ictal symptoms. She did not seek medical evaluation at the time. Does not skips meals. Maintain adequate oral hydration, at least 64oz per day. No hx of anemia. No Fhx of sudden death.  Pending holter monitor  and echocardiogram report. Positive orthostatic hypotension. Advised to Use knee high compression stocking during the day and off at night. Maintain adequate oral hydration and do not limit sodium intake. Consider discontinuation of spironolactone if normal echocardiogram and holter monitor  Reviewed medical, surgical, and social history today  Medications: Outpatient Medications Prior to Visit  Medication Sig   clindamycin (CLINDAGEL) 1 % gel    spironolactone (ALDACTONE) 25 MG tablet Take 25 mg by mouth daily.   tretinoin (RETIN-A) 0.1 % cream Apply topically at bedtime.   No facility-administered medications prior to visit.   Reviewed past medical and social history.   ROS per HPI above  Last CBC Lab Results  Component Value Date   WBC 11.6 (H) 04/18/2022   HGB 14.1 04/18/2022   HCT 41.3 04/18/2022   MCV 92.9 04/18/2022   RDW 12.8 04/18/2022   PLT 413.0 (H) 04/18/2022   Last metabolic panel Lab Results  Component Value Date   GLUCOSE 85 04/18/2022   NA 136 04/18/2022   K 4.8 04/18/2022   CL 102  04/18/2022   CO2 29 04/18/2022   BUN 13 04/18/2022   CREATININE 0.77 04/18/2022   CALCIUM 9.5 04/18/2022   PROT 7.1 04/18/2022   ALBUMIN 4.7 04/18/2022   BILITOT 0.7 04/18/2022   ALKPHOS 41 04/18/2022   AST 15 04/18/2022   ALT 10 04/18/2022   Last thyroid functions Lab Results  Component Value Date   TSH 1.75 04/18/2022        Objective:  BP 102/62 (BP Location: Left Arm, Patient Position: Sitting, Cuff Size: Small)   Pulse 95   Temp 97.6 F (36.4 C) (Temporal)   Ht 5\' 8"  (1.727 m)   Wt 144 lb 9.6 oz (65.6 kg)   LMP 03/21/2022   SpO2 99%   BMI 21.99 kg/m      Physical Exam Vitals reviewed.  Cardiovascular:     Rate and Rhythm: Normal rate and regular rhythm.     Pulses: Normal pulses.     Heart sounds: Normal heart sounds.  Pulmonary:     Effort: Pulmonary effort is normal.     Breath sounds: Normal breath sounds.  Neurological:     Mental Status: She is alert and oriented to person, place, and time.  Psychiatric:        Mood and Affect: Mood normal.        Behavior: Behavior normal.        Thought Content: Thought content normal.  No results found for any visits on 05/12/22.    Assessment & Plan:    Problem List Items Addressed This Visit       Other   Syncope - Primary    Onset at age 29, initially had intermittent feeling faint but not syncopal episode at that time. Associated with cold sweats, SOB, palpitation and dizziness. Unknown trigger. Occurs at random times. In July 2023, she had a syncopal episode (standing with friend talking, felt faint, turned to go sit down), LOC <45min, witnessed by her friend, no seizure activity noted, no post ictal symptoms. She did not seek medical evaluation at the time. Does not skips meals. Maintain adequate oral hydration, at least 64oz per day. No hx of anemia. No Fhx of sudden death.  Pending holter monitor  and echocardiogram report. Positive orthostatic hypotension. Advised to Use knee high compression  stocking during the day and off at night. Maintain adequate oral hydration and do not limit sodium intake. Consider discontinuation of spironolactone if normal echocardiogram and holter monitor      Return if symptoms worsen or fail to improve.     Alysia Penna, NP

## 2022-05-12 NOTE — Patient Instructions (Signed)
Proceed with echocardiogram and long term heart monitor. Use knee high compression stocking during the day and off at night. Maintain adequate oral hydration and do not limit sodium intake.  Postural Orthostatic Tachycardia Syndrome Postural orthostatic tachycardia syndrome (POTS) is a group of symptoms that occur along with an increase in heart rate when a person stands up after lying down. The symptoms include light-headedness or fainting, and they improve when the person lies back down. POTS may be associated with another medical condition, or it may occur on its own. What are the causes? The cause of this condition is not known, but many conditions and diseases are associated with it. What increases the risk? This condition is more likely to develop in: Women 51-36 years old. Women who are pregnant. Women who are in their period (menstruating). People who have certain conditions, such as: Infection from a virus. Diseases that cause the body's defense system (immune system) to attack healthy organs. These are called autoimmune diseases. Losing a lot of red blood cells (anemia). Losing too much water in the body (dehydration). An overactive thyroid (hyperthyroidism). People who take certain medicines. People who have had a major injury. People who have had surgery. What are the signs or symptoms? The most common symptom of this condition is light-headedness when you stand up from a lying or sitting position. Other symptoms may include: Feeling a rapid increase in the heartbeat (tachycardia) within 10 minutes of standing up. Chest pain. Shortness of breath. Breathing that is deeper and faster than normal (hyperventilation). Fainting. Confusion. Trembling. Weakness. Headache. Anxiety. Nausea. Sweating or flushing. Symptoms may be worse in the morning, and they may be relieved by lying down. How is this diagnosed? This condition is diagnosed based on: Your symptoms. Your  medical history. A physical exam. Checking your heart rate when you are lying down and after you stand up. Checking your blood pressure when you go from lying down to standing up. Blood and urine tests to measure hormones that change with blood pressure. The blood tests will be done when you are lying down and when you are standing up. You may have other tests to check for conditions or diseases that are associated with POTS. How is this treated? Treatment for this condition depends on how severe your symptoms are and whether you have any conditions or diseases that are associated with POTS. Treatment may involve: Treating any conditions or diseases that are associated with POTS. Drinking two glasses of water before getting up from a lying position. Increasing salt (sodium) in your diet. Taking medicine to control blood pressure and heart rate (beta-blocker). Avoiding certain medicines. Starting an exercise program under the supervision of a health care provider. Follow these instructions at home: Medicines Take over-the-counter and prescription medicines only as told by your health care provider. Let your health care provider know about all prescription or over-the-counter medicines you take. These include herbs, vitamins, and supplements. You may need to stop or adjust some medicines if they cause this condition. Talk with your health care provider before starting any new medicines. Eating and drinking  Drink enough fluid to keep your urine pale yellow. If told by your health care provider, drink two glasses of water before getting up from a lying position. Follow instructions from your health care provider about how much sodium you should include in your diet. Avoid heavy meals. Eat several small meals a day instead of a few large meals. General instructions Do an aerobic exercise for 20 minutes  a day, at least 3 days a week. Aerobic exercises are those that cause your heart to beat  faster. Ask your health care provider what kinds of exercise are safe for you. Do not use any products that contain nicotine or tobacco. These products include cigarettes, chewing tobacco, and vaping devices, such as e-cigarettes. These can interfere with blood flow. If you need help quitting, ask your health care provider. Keep all follow-up visits. This is important. Contact a health care provider if: Your symptoms do not improve after treatment. Your symptoms get worse. You develop new symptoms. Get help right away if: You have chest pain. You have difficulty breathing. You have fainting episodes. These symptoms may be an emergency. Get help right away. Call 911. Do not wait to see if the symptoms will go away. Do not drive yourself to the hospital. Summary POTS is a group of symptoms that occur along with an increase in heart rate when a person stands up after lying down. The most common symptom is light-headedness when you stand up. Treatment for this condition includes treating any underlying conditions, drinking plenty of water, stopping or changing some medicines, or starting an exercise program. Get help right away if you have chest pain, difficulty breathing, or fainting episodes. These symptoms may be an emergency. This information is not intended to replace advice given to you by your health care provider. Make sure you discuss any questions you have with your health care provider. Document Revised: 11/25/2020 Document Reviewed: 11/25/2020 Elsevier Patient Education  2023 [employer].

## 2022-06-02 ENCOUNTER — Ambulatory Visit (HOSPITAL_COMMUNITY): Payer: BLUE CROSS/BLUE SHIELD | Attending: Family Medicine

## 2022-06-02 DIAGNOSIS — R0602 Shortness of breath: Secondary | ICD-10-CM | POA: Diagnosis not present

## 2022-06-02 DIAGNOSIS — R002 Palpitations: Secondary | ICD-10-CM | POA: Insufficient documentation

## 2022-06-02 DIAGNOSIS — R55 Syncope and collapse: Secondary | ICD-10-CM | POA: Insufficient documentation

## 2022-06-02 LAB — ECHOCARDIOGRAM COMPLETE
Area-P 1/2: 6.32 cm2
S' Lateral: 2.9 cm

## 2022-06-27 ENCOUNTER — Encounter: Payer: Self-pay | Admitting: *Deleted

## 2022-06-27 ENCOUNTER — Other Ambulatory Visit: Payer: Self-pay

## 2022-06-27 ENCOUNTER — Ambulatory Visit
Admission: EM | Admit: 2022-06-27 | Discharge: 2022-06-27 | Disposition: A | Discharge status: Home / Self Care | Payer: BC Managed Care – PPO | Attending: Internal Medicine | Admitting: Internal Medicine

## 2022-06-27 DIAGNOSIS — J029 Acute pharyngitis, unspecified: Secondary | ICD-10-CM | POA: Insufficient documentation

## 2022-06-27 LAB — POCT MONO SCREEN (KUC): Mono, POC: NEGATIVE

## 2022-06-27 LAB — POCT RAPID STREP A (OFFICE): Rapid Strep A Screen: NEGATIVE

## 2022-06-27 MED ORDER — ACETAMINOPHEN 325 MG PO TABS
650.0000 mg | ORAL_TABLET | Freq: Once | ORAL | Status: AC
  Administered 2022-06-27: 650 mg via ORAL

## 2022-06-27 MED ORDER — AMOXICILLIN 500 MG PO CAPS: 500.0000 mg | ORAL_CAPSULE | Freq: Two times a day (BID) | ORAL | 0 refills | Status: AC

## 2022-06-27 NOTE — Discharge Instructions (Signed)
Your rapid strep and rapid monotest negative.  I am still suspicious of strep throat given the appearance of your throat on exam so I am treating this with antibiotic.  Follow-up if any symptoms persist or worsen.

## 2022-06-27 NOTE — ED Triage Notes (Signed)
Pt saw white spots on tonsils this morning.

## 2022-06-27 NOTE — ED Provider Notes (Signed)
[facility]    CSN: [id number] Arrival date & time: 06/27/22  1602      History   Chief Complaint Chief Complaint  Patient presents with   Sore Throat    HPI Katrina Gibbs is a 22 y.o. female.   Patient presents with sore throat and nasal drainage that started today.  Patient reports she does have a known sick contact at work.  Denies any fever at home.  Has taken Advil a few hours prior to arrival to urgent care for symptoms.  Denies any associated cough, chest pain, shortness of breath, nausea, vomiting, diarrhea, abdominal pain.   Sore Throat    Past Medical History:  Diagnosis Date   Constipation     Patient Active Problem List   Diagnosis Date Noted   Syncope 04/18/2022   Preventative health care 02/07/2016   Migraines 11/03/2015    Past Surgical History:  Procedure Laterality Date   WISDOM TOOTH EXTRACTION      OB History   No obstetric history on file.      Home Medications    Prior to Admission medications   Medication Sig Start Date End Date Taking? Authorizing Provider  amoxicillin (AMOXIL) 500 MG capsule Take 1 capsule (500 mg total) by mouth 2 (two) times daily for 10 days. 06/27/22 07/07/22 Yes Garreth Burnsworth, Michele Rockers, FNP  clindamycin (CLINDAGEL) 1 % gel  02/04/18   [provider]  spironolactone (ALDACTONE) 25 MG tablet Take 25 mg by mouth daily.    [provider]  tretinoin (RETIN-A) 0.1 % cream Apply topically at bedtime. 08/05/20   [provider]    Family History Family History  Problem Relation Age of Onset   Hyperlipidemia Father    Hypertension Father    Heart disease Maternal Grandmother    Hypertension Maternal Grandmother    Diabetes Maternal Grandmother    Stroke Maternal Grandfather    Hyperlipidemia Paternal Grandmother    Stroke Paternal Grandfather    Hypertension Paternal Grandfather    Irritable bowel syndrome Neg Hx    Celiac disease Neg Hx    Colitis Neg Hx    Crohn's disease Neg  Hx    GER disease Neg Hx     Social History Social History   Tobacco Use   Smoking status: Never   Smokeless tobacco: Never  Vaping Use   Vaping Use: Former   Start date: 12/22/2016   Substances: Nicotine  Substance Use Topics   Alcohol use: No   Drug use: No     Allergies   Patient has no known allergies.   Review of Systems Review of Systems Per HPI  Physical Exam Triage Vital Signs ED Triage Vitals  Enc Vitals Group     BP 06/27/22 1612 114/74     Pulse Rate 06/27/22 1612 (!) 104     Resp 06/27/22 1612 18     Temp 06/27/22 1612 99.2 F (37.3 C)     Temp src --      SpO2 06/27/22 1612 99 %     Weight --      Height --      Head Circumference --      Peak Flow --      Pain Score 06/27/22 1610 5     Pain Loc --      Pain Edu? --      Excl. in [place]? --    No data found.  Updated Vital Signs BP 114/74  Pulse 99   Temp 98 F (36.7 C) (Oral)   Resp 18   LMP 06/17/2022   SpO2 98%   Visual Acuity Right Eye Distance:   Left Eye Distance:   Bilateral Distance:    Right Eye Near:   Left Eye Near:    Bilateral Near:     Physical Exam Constitutional:      General: She is not in acute distress.    Appearance: Normal appearance. She is not toxic-appearing or diaphoretic.  HENT:     Head: Normocephalic and atraumatic.     Right Ear: Tympanic membrane and ear canal normal.     Left Ear: Tympanic membrane and ear canal normal.     Nose: Congestion present.     Mouth/Throat:     Mouth: Mucous membranes are moist.     Pharynx: Posterior oropharyngeal erythema present. No oropharyngeal exudate.     Tonsils: Tonsillar exudate present. No tonsillar abscesses. 1+ on the right. 1+ on the left.  Eyes:     Extraocular Movements: Extraocular movements intact.     Conjunctiva/sclera: Conjunctivae normal.     Pupils: Pupils are equal, round, and reactive to light.  Cardiovascular:     Rate and Rhythm: Normal rate and regular rhythm.     Pulses: Normal pulses.      Heart sounds: Normal heart sounds.  Pulmonary:     Effort: Pulmonary effort is normal. No respiratory distress.     Breath sounds: Normal breath sounds. No wheezing.  Abdominal:     General: Abdomen is flat. Bowel sounds are normal.     Palpations: Abdomen is soft.  Musculoskeletal:        General: Normal range of motion.     Cervical back: Normal range of motion.  Skin:    General: Skin is warm and dry.  Neurological:     General: No focal deficit present.     Mental Status: She is alert and oriented to person, place, and time. Mental status is at baseline.  Psychiatric:        Mood and Affect: Mood normal.        Behavior: Behavior normal.      UC Treatments / Results  Labs (all labs ordered are listed, but only abnormal results are displayed) Labs Reviewed  CULTURE, GROUP A STREP [facility])  POCT RAPID STREP A (OFFICE)  POCT MONO SCREEN ([facility])    EKG   Radiology No results found.  Procedures Procedures (including critical care time)  Medications Ordered in UC Medications  acetaminophen (TYLENOL) tablet 650 mg (650 mg Oral Given 06/27/22 1628)    Initial Impression / Assessment and Plan / UC Course  I have reviewed the triage vital signs and the nursing notes.  Pertinent labs & imaging results that were available during my care of the patient were reviewed by me and considered in my medical decision making (see chart for details).     Rapid strep is negative but I am still highly suspicious of strep given appearance of posterior pharynx on exam with exudate.  No signs of peritonsillar abscess on exam.  Will treat with amoxicillin.  Throat culture pending.  Rapid mono negative.  Discussed with patient the possibility of viral illness as well.  COVID test pending. Tylenol administered with improvement in heart rate and fever.  Discussed return precautions.  Patient verbalized understanding and was agreeable with plan. Final Clinical Impressions(s) / UC Diagnoses    Final diagnoses:  Sore throat  Discharge Instructions      Your rapid strep and rapid monotest negative.  I am still suspicious of strep throat given the appearance of your throat on exam so I am treating this with antibiotic.  Follow-up if any symptoms persist or worsen.     ED Prescriptions     Medication Sig Dispense Auth. Provider   amoxicillin (AMOXIL) 500 MG capsule Take 1 capsule (500 mg total) by mouth 2 (two) times daily for 10 days. 20 capsule Teodora Medici, [place]      PDMP not reviewed this encounter.   Teodora Medici, [place] 06/27/22 1700

## 2022-06-28 ENCOUNTER — Ambulatory Visit: Payer: BLUE CROSS/BLUE SHIELD | Admitting: Nurse Practitioner

## 2022-06-30 LAB — CULTURE, GROUP A STREP (THRC)

## 2022-07-14 ENCOUNTER — Ambulatory Visit: Payer: BC Managed Care – PPO | Admitting: Family Medicine

## 2022-07-14 ENCOUNTER — Encounter: Payer: Self-pay | Admitting: Family Medicine

## 2022-07-14 VITALS — BP 110/74 | HR 123 | Temp 98.8°F | Wt 146.0 lb

## 2022-07-14 DIAGNOSIS — R059 Cough, unspecified: Secondary | ICD-10-CM | POA: Diagnosis not present

## 2022-07-14 DIAGNOSIS — R0981 Nasal congestion: Secondary | ICD-10-CM | POA: Diagnosis not present

## 2022-07-14 LAB — POCT INFLUENZA A/B
Influenza A, POC: NEGATIVE
Influenza B, POC: NEGATIVE

## 2022-07-14 LAB — POC COVID19 BINAXNOW: SARS Coronavirus 2 Ag: NEGATIVE

## 2022-07-14 MED ORDER — FLUTICASONE PROPIONATE 50 MCG/ACT NA SUSP: 2.0000 | Freq: Every day | NASAL | 6 refills | Status: DC

## 2022-07-14 MED ORDER — BENZONATATE 100 MG PO CAPS: 100.0000 mg | ORAL_CAPSULE | Freq: Two times a day (BID) | ORAL | 0 refills | Status: DC | PRN

## 2022-07-14 NOTE — Patient Instructions (Signed)
Please be sure to drink plenty of fluids.   You may take the following OTC medications to help with symptoms:  For cough, use  cough syrups or other cough suppressants.  For headache, sore throat, fevers, muscle aches, chills, other pain, take ibuprofen or tylenol  For congestion, use nasal sprays, decongestants, or antihistamines  Please follow up if no improvement.   Go to ED if you have severe chest pain, fevers, shortness of breath or other worrisome symptoms.

## 2022-07-14 NOTE — Progress Notes (Unsigned)
Assessment/Plan:   Problem List Items Addressed This Visit   None Visit Diagnoses     Cough, unspecified type    -  Primary   Relevant Orders   POCT Influenza A/B   POC COVID-19 BinaxNow       There are no discontinued medications.    Subjective:  HPI: Encounter date: 07/14/2022  Katrina Gibbs is a 22 y.o. female who has Migraines; Preventative health care; and Syncope on their problem list..   She  has a past medical history of Constipation..   She presents with chief complaint of Cough (Cough, runny nose and congestion x Monday. Has tx with mucinex with no relief. ) .     Past Surgical History:  Procedure Laterality Date   WISDOM TOOTH EXTRACTION      Outpatient Medications Prior to Visit  Medication Sig Dispense Refill   clindamycin (CLINDAGEL) 1 % gel   2   spironolactone (ALDACTONE) 25 MG tablet Take 25 mg by mouth daily.     tretinoin (RETIN-A) 0.1 % cream Apply topically at bedtime.     No facility-administered medications prior to visit.    Family History  Problem Relation Age of Onset   Hyperlipidemia Father    Hypertension Father    Heart disease Maternal Grandmother    Hypertension Maternal Grandmother    Diabetes Maternal Grandmother    Stroke Maternal Grandfather    Hyperlipidemia Paternal Grandmother    Stroke Paternal Grandfather    Hypertension Paternal Grandfather    Irritable bowel syndrome Neg Hx    Celiac disease Neg Hx    Colitis Neg Hx    Crohn's disease Neg Hx    GER disease Neg Hx     Social History   Socioeconomic History   Marital status: Single    Spouse name: Not on file   Number of children: 0   Years of education: 9   Highest education level: Not on file  Occupational History   Occupation: Ship broker  Tobacco Use   Smoking status: Never   Smokeless tobacco: Never  Vaping Use   Vaping Use: Former   Start date: 12/22/2016   Substances: Nicotine  Substance and Sexual Activity   Alcohol use: No   Drug use:  No   Sexual activity: Yes    Birth control/protection: I.U.D.  Other Topics Concern   Not on file  Social History Narrative   12th grade taking online classes   Social Determinants of Health   Financial Resource Strain: Not on file  Food Insecurity: Not on file  Transportation Needs: Not on file  Physical Activity: Not on file  Stress: Not on file  Social Connections: Not on file  Intimate Partner Violence: Not on file                                                                                                 Objective:  Physical Exam: BP 110/74 (BP Location: Left Arm, Patient Position: Sitting, Cuff Size: Large)   Pulse (!) 123   Temp 98.8 F (37.1 C) (Oral)   Wt 146  lb (66.2 kg)   LMP 06/17/2022   SpO2 99%   BMI 22.20 kg/m    ***General: No acute distress. Awake and conversant.  Eyes: Normal conjunctiva, anicteric. Round symmetric pupils.  ENT: Hearing grossly intact. No nasal discharge.  Neck: Neck is supple. No masses or thyromegaly.  Respiratory: Respirations are non-labored. No auditory wheezing.  Skin: Warm. No rashes or ulcers.  Psych: Alert and oriented. Cooperative, Appropriate mood and affect, Normal judgment.  CV: No cyanosis or JVD MSK: Normal ambulation. No clubbing  Neuro: Sensation and CN II-XII grossly normal.   No results found for any visits on 07/14/22.       Alesia Banda, MD, MS

## 2022-07-17 DIAGNOSIS — J019 Acute sinusitis, unspecified: Secondary | ICD-10-CM | POA: Insufficient documentation

## 2022-07-17 DIAGNOSIS — R059 Cough, unspecified: Secondary | ICD-10-CM | POA: Insufficient documentation

## 2022-07-17 HISTORY — DX: Acute sinusitis, unspecified: J01.90

## 2022-07-17 NOTE — Assessment & Plan Note (Signed)
Patient presents with a cough and related symptoms of upper respiratory tract infection.   Differential diagnosis:  Viral Upper Respiratory Infection: Most likely, given the presentation and negative test results for influenza and COVID-19. Allergic Rhinitis: Given current season and symptomatology. Bacterial Upper Respiratory Infection: Less likely due to negative recent strep test and the absence of hallmark symptoms.  Plan:  Advise continuation of over-the-counter medications for symptomatic relief. Prescribe benzonatate (TESSALON) 100 MG capsule: Beneficial for relief of cough. Prescribe fluticasone (FLONASE) 50 MCG/ACT nasal spray: Aids in reduction of nasal inflammation and congestion. Advise patient to monitor symptoms and return if they worsen or do not improve in the next 1-2 weeks. Recommend increased fluid intake, rest, and hand hygiene to aid recovery and minimize spread. Discuss precautionary measures to reduce transmission to others and the continuation of wearing a mask in public for the advised period.

## 2022-07-27 ENCOUNTER — Ambulatory Visit
Admission: RE | Admit: 2022-07-27 | Discharge: 2022-07-27 | Disposition: A | Discharge status: Home / Self Care | Payer: BC Managed Care – PPO | Source: Ambulatory Visit | Attending: Physician Assistant | Admitting: Physician Assistant

## 2022-07-27 VITALS — BP 100/68 | HR 92 | Temp 98.0°F | Resp 12

## 2022-07-27 DIAGNOSIS — J069 Acute upper respiratory infection, unspecified: Secondary | ICD-10-CM | POA: Insufficient documentation

## 2022-07-27 DIAGNOSIS — J029 Acute pharyngitis, unspecified: Secondary | ICD-10-CM | POA: Insufficient documentation

## 2022-07-27 LAB — POCT RAPID STREP A (OFFICE): Rapid Strep A Screen: NEGATIVE

## 2022-07-27 NOTE — ED Triage Notes (Signed)
Pt has been sick x 1 month with flu like symptoms. Pt now has chest congestion and painful swallowing x las t few days

## 2022-07-27 NOTE — Discharge Instructions (Signed)
Advised to increase vitamin C intake to 1000-1500 mg a day to help boost immune system. Advised to take a multivitamin once daily for the next 2 to 3 weeks to help boost the immune system.  Advised to decrease the amount of vaping over the next couple weeks to allow the immune system to improve.  Advised to continue to exercise on a regular basis as tolerated.  Advised to ensure good fluid intake throughout the day, normal nutritional meals, and good sleep hygiene.  Advised follow-up PCP or return to urgent care if symptoms fail to improve.

## 2022-07-27 NOTE — ED Provider Notes (Signed)
[facility]    CSN: [id number] Arrival date & time: 07/27/22  1043      History   Chief Complaint No chief complaint on file.   HPI Katrina Gibbs is a 22 y.o. female.   23 year old female presents with sore throat, upper respiratory congestion.  Patient indicates that for the past month she has been having recurrent upper respiratory infections and intermittent sore throat.  Patient indicates that the infection started 1 month ago, she was having sore throat and congestion.  She was seen and evaluated and had a negative strep test, and monotest.  Patient indicates that she improved however she then became sick again with upper respiratory congestion and she saw her PCP who did a COVID and flu test, which were negative.  Patient indicates that she is currently taking Flonase nasal spray and some Tessalon Perles to control cough.  She indicates that this morning she woke up having a sore throat and painful swallowing with white patches on the back of the throat.  She indicates throughout the past 2 weeks she continues to have upper respiratory congestion, rhinitis, mainly clear production.  She has not had fever, chills, wheezing, shortness of breath or nausea or vomiting.  Patient indicates she is drinking fluids and eating normally.  She indicates that she has not traveled out of the state or out of the country over the past 60 days.  Patient indicates she did have evaluation at the end of December which involved Holter monitor, cardiology evaluation, and chest x-ray which were all normal due to having a syncopal episode.  She indicates that she does vape on a regular basis daily menthol flavored nicotine product.  She denies any weight loss.     Past Medical History:  Diagnosis Date   Constipation     Patient Active Problem List   Diagnosis Date Noted   Cough 07/17/2022   Syncope 04/18/2022   Preventative health care 02/07/2016   Migraines 11/03/2015    Past  Surgical History:  Procedure Laterality Date   WISDOM TOOTH EXTRACTION      OB History   No obstetric history on file.      Home Medications    Prior to Admission medications   Medication Sig Start Date End Date Taking? Authorizing Provider  clindamycin (CLINDAGEL) 1 % gel  02/04/18  Yes [provider]  fluticasone (FLONASE) 50 MCG/ACT nasal spray Place 2 sprays into both nostrils daily. 07/14/22  Yes Bonnita Hollow, MD  spironolactone (ALDACTONE) 25 MG tablet Take 25 mg by mouth daily.   Yes [provider]  tretinoin (RETIN-A) 0.1 % cream Apply topically at bedtime. 08/05/20  Yes [provider]  benzonatate (TESSALON) 100 MG capsule Take 1 capsule (100 mg total) by mouth 2 (two) times daily as needed for cough. 07/14/22   Bonnita Hollow, MD    Family History Family History  Problem Relation Age of Onset   Hyperlipidemia Father    Hypertension Father    Heart disease Maternal Grandmother    Hypertension Maternal Grandmother    Diabetes Maternal Grandmother    Stroke Maternal Grandfather    Hyperlipidemia Paternal Grandmother    Stroke Paternal Grandfather    Hypertension Paternal Grandfather    Irritable bowel syndrome Neg Hx    Celiac disease Neg Hx    Colitis Neg Hx    Crohn's disease Neg Hx    GER disease Neg Hx     Social History Social History  Tobacco Use   Smoking status: Never   Smokeless tobacco: Never  Vaping Use   Vaping Use: Former   Start date: 12/22/2016   Substances: Nicotine  Substance Use Topics   Alcohol use: No   Drug use: No     Allergies   Patient has no known allergies.   Review of Systems Review of Systems  HENT:  Positive for postnasal drip, sinus pressure and sore throat.   Respiratory:  Positive for cough.      Physical Exam Triage Vital Signs ED Triage Vitals  Enc Vitals Group     BP 07/27/22 1052 100/68     Pulse Rate 07/27/22 1052 92     Resp 07/27/22 1052 12     Temp 07/27/22 1052  98 F (36.7 C)     Temp Source 07/27/22 1052 Oral     SpO2 07/27/22 1052 95 %     Weight --      Height --      Head Circumference --      Peak Flow --      Pain Score 07/27/22 1054 3     Pain Loc --      Pain Edu? --      Excl. in [place]? --    No data found.  Updated Vital Signs BP 100/68 (BP Location: Right Arm)   Pulse 92   Temp 98 F (36.7 C) (Oral)   Resp 12   LMP 07/15/2022   SpO2 95%   Visual Acuity Right Eye Distance:   Left Eye Distance:   Bilateral Distance:    Right Eye Near:   Left Eye Near:    Bilateral Near:     Physical Exam Constitutional:      Appearance: Normal appearance.  HENT:     Right Ear: Ear canal normal. Tympanic membrane is injected.     Left Ear: Ear canal normal. Tympanic membrane is injected.     Mouth/Throat:     Mouth: Mucous membranes are moist.     Pharynx: Oropharyngeal exudate (mild mainly left tonsils) and posterior oropharyngeal erythema present.  Cardiovascular:     Rate and Rhythm: Normal rate and regular rhythm.     Heart sounds: Normal heart sounds.  Pulmonary:     Effort: Pulmonary effort is normal.     Breath sounds: Normal breath sounds and air entry. No wheezing, rhonchi or rales.  Lymphadenopathy:     Cervical: Cervical adenopathy (mild bilat anterior adenopathy present) present.  Neurological:     Mental Status: She is alert.      UC Treatments / Results  Labs (all labs ordered are listed, but only abnormal results are displayed) Labs Reviewed  CULTURE, GROUP A STREP [facility])  POCT RAPID STREP A (OFFICE)    EKG   Radiology No results found.  Procedures Procedures (including critical care time)  Medications Ordered in UC Medications - No data to display  Initial Impression / Assessment and Plan / UC Course  I have reviewed the triage vital signs and the nursing notes.  Pertinent labs & imaging results that were available during my care of the patient were reviewed by me and considered in my  medical decision making (see chart for details).    Plan: The diagnosis be treated with the following: 1.  Upper respiratory tract infection: A.  Advised to continue using Flonase nasal spray to help decrease congestion and drainage. B.  Advised to increase vitamin C intake to 1000-1500 mg daily  to help boost immune system. 2.  Sore throat: A.  Advised take ibuprofen or Motrin along with lozenges and gargles to help soothe the throat. B. 3.  Advised to take a multivitamin daily to help boost the immune system.  Advised to decrease vaping as this may affect the respiratory infections and duration. 4.  Advised follow-up PCP or return to urgent care as needed. Final Clinical Impressions(s) / UC Diagnoses   Final diagnoses:  Acute upper respiratory infection  Sore throat     Discharge Instructions      Advised to increase vitamin C intake to 1000-1500 mg a day to help boost immune system. Advised to take a multivitamin once daily for the next 2 to 3 weeks to help boost the immune system.  Advised to decrease the amount of vaping over the next couple weeks to allow the immune system to improve.  Advised to continue to exercise on a regular basis as tolerated.  Advised to ensure good fluid intake throughout the day, normal nutritional meals, and good sleep hygiene.  Advised follow-up PCP or return to urgent care if symptoms fail to improve.    ED Prescriptions   None    PDMP not reviewed this encounter.   Nyoka Lint, PA-C 07/27/22 1144

## 2022-07-29 LAB — CULTURE, GROUP A STREP (THRC)

## 2022-08-17 DIAGNOSIS — J0391 Acute recurrent tonsillitis, unspecified: Secondary | ICD-10-CM | POA: Diagnosis not present

## 2022-10-13 DIAGNOSIS — N898 Other specified noninflammatory disorders of vagina: Secondary | ICD-10-CM | POA: Diagnosis not present

## 2022-10-13 DIAGNOSIS — Z113 Encounter for screening for infections with a predominantly sexual mode of transmission: Secondary | ICD-10-CM | POA: Diagnosis not present

## 2023-03-23 DIAGNOSIS — Z01419 Encounter for gynecological examination (general) (routine) without abnormal findings: Secondary | ICD-10-CM | POA: Diagnosis not present

## 2023-03-23 DIAGNOSIS — R319 Hematuria, unspecified: Secondary | ICD-10-CM | POA: Diagnosis not present

## 2023-03-23 DIAGNOSIS — Z113 Encounter for screening for infections with a predominantly sexual mode of transmission: Secondary | ICD-10-CM | POA: Diagnosis not present

## 2023-03-23 DIAGNOSIS — Z6822 Body mass index (BMI) 22.0-22.9, adult: Secondary | ICD-10-CM | POA: Diagnosis not present

## 2023-05-09 DIAGNOSIS — D2261 Melanocytic nevi of right upper limb, including shoulder: Secondary | ICD-10-CM | POA: Diagnosis not present

## 2023-05-09 DIAGNOSIS — D2262 Melanocytic nevi of left upper limb, including shoulder: Secondary | ICD-10-CM | POA: Diagnosis not present

## 2023-05-09 DIAGNOSIS — D225 Melanocytic nevi of trunk: Secondary | ICD-10-CM | POA: Diagnosis not present

## 2023-05-09 DIAGNOSIS — L700 Acne vulgaris: Secondary | ICD-10-CM | POA: Diagnosis not present

## 2023-06-05 ENCOUNTER — Emergency Department (HOSPITAL_BASED_OUTPATIENT_CLINIC_OR_DEPARTMENT_OTHER)
Admission: EM | Admit: 2023-06-05 | Discharge: 2023-06-06 | Disposition: A | Discharge status: Home / Self Care | Payer: BC Managed Care – PPO

## 2023-06-05 ENCOUNTER — Other Ambulatory Visit: Payer: Self-pay

## 2023-06-05 ENCOUNTER — Encounter (HOSPITAL_BASED_OUTPATIENT_CLINIC_OR_DEPARTMENT_OTHER): Payer: Self-pay | Admitting: Emergency Medicine

## 2023-06-05 ENCOUNTER — Emergency Department (HOSPITAL_BASED_OUTPATIENT_CLINIC_OR_DEPARTMENT_OTHER): Payer: BC Managed Care – PPO

## 2023-06-05 DIAGNOSIS — R1031 Right lower quadrant pain: Secondary | ICD-10-CM | POA: Diagnosis not present

## 2023-06-05 DIAGNOSIS — D72829 Elevated white blood cell count, unspecified: Secondary | ICD-10-CM | POA: Insufficient documentation

## 2023-06-05 LAB — URINALYSIS, ROUTINE W REFLEX MICROSCOPIC
Bilirubin Urine: NEGATIVE
Glucose, UA: NEGATIVE mg/dL
Ketones, ur: 40 mg/dL — AB
Leukocytes,Ua: NEGATIVE
Nitrite: NEGATIVE
Protein, ur: 30 mg/dL — AB
Specific Gravity, Urine: >1.046 — ABNORMAL HIGH (ref 1.005–1.030)
pH: 6.5 (ref 5.0–8.0)

## 2023-06-05 LAB — COMPREHENSIVE METABOLIC PANEL
ALT: 21 U/L (ref 0–44)
AST: 47 U/L — ABNORMAL HIGH (ref 15–41)
Albumin: 5.2 g/dL — ABNORMAL HIGH (ref 3.5–5.0)
Alkaline Phosphatase: 52 U/L (ref 38–126)
Anion gap: 13 (ref 5–15)
BUN: 9 mg/dL (ref 6–20)
CO2: 21 mmol/L — ABNORMAL LOW (ref 22–32)
Calcium: 9.4 mg/dL (ref 8.9–10.3)
Chloride: 101 mmol/L (ref 98–111)
Creatinine, Ser: 0.61 mg/dL (ref 0.44–1.00)
GFR, Estimated: >60 mL/min (ref >60)
Glucose, Bld: 92 mg/dL (ref 70–99)
Potassium: 3.6 mmol/L (ref 3.5–5.1)
Sodium: 135 mmol/L (ref 135–145)
Total Bilirubin: 0.5 mg/dL (ref 0.0–1.2)
Total Protein: 8.1 g/dL (ref 6.5–8.1)

## 2023-06-05 LAB — PREGNANCY, URINE: Preg Test, Ur: NEGATIVE

## 2023-06-05 LAB — LIPASE, BLOOD: Lipase: 16 U/L (ref 11–51)

## 2023-06-05 LAB — CBC
HCT: 40 % (ref 36.0–46.0)
Hemoglobin: 14.2 g/dL (ref 12.0–15.0)
MCH: 31.8 pg (ref 26.0–34.0)
MCHC: 35.5 g/dL (ref 30.0–36.0)
MCV: 89.5 fL (ref 80.0–100.0)
Platelets: 449 10*3/uL — ABNORMAL HIGH (ref 150–400)
RBC: 4.47 MIL/uL (ref 3.87–5.11)
RDW: 12.6 % (ref 11.5–15.5)
WBC: 18.6 10*3/uL — ABNORMAL HIGH (ref 4.0–10.5)
nRBC: 0 % (ref 0.0–0.2)

## 2023-06-05 MED ORDER — IOHEXOL 300 MG/ML  SOLN
100.0000 mL | Freq: Once | INTRAMUSCULAR | Status: AC | PRN
  Administered 2023-06-05: 85 mL via INTRAVENOUS

## 2023-06-05 NOTE — ED Triage Notes (Signed)
 CC/o RLQ pain since yesterday, States pain worse with ambulating and touch. Guarding during triage.

## 2023-06-05 NOTE — ED Notes (Signed)
 ED Provider at bedside.

## 2023-06-05 NOTE — ED Provider Notes (Signed)
 [place] [facility] AT [facility] Provider Note   CSN: [id number] Arrival date & time: 06/05/23  1756     History Chief Complaint  Patient presents with   Abdominal Pain    Katrina Gibbs is a 23 y.o. female.  Patient with out significant past medical history presents to the emergency department with concerns of abdominal pain.  Reports that she has been experiencing right lower quad abdominal pain since yesterday.  Does report that a few days ago she did have some right lower quadrant pain with this.  Resolved.  Right lower quadrant pain is now more persistent and worsened with ambulation and flexion extension of the right lower leg.  Denies any fever, chills, body aches, vomiting or diarrhea.  No urinary symptoms.  No prior history of any abdominal surgeries.   Abdominal Pain      Home Medications Prior to Admission medications   Medication Sig Start Date End Date Taking? Authorizing Provider  benzonatate  (TESSALON ) 100 MG capsule Take 1 capsule (100 mg total) by mouth 2 (two) times daily as needed for cough. 07/14/22   Sebastian Beverley NOVAK, MD  clindamycin (CLINDAGEL) 1 % gel  02/04/18   [provider]  fluticasone  (FLONASE ) 50 MCG/ACT nasal spray Place 2 sprays into both nostrils daily. 07/14/22   Sebastian Beverley NOVAK, MD  spironolactone (ALDACTONE) 25 MG tablet Take 25 mg by mouth daily.    [provider]  tretinoin (RETIN-A) 0.1 % cream Apply topically at bedtime. 08/05/20   [provider]      Allergies    Patient has no known allergies.    Review of Systems   Review of Systems  Gastrointestinal:  Positive for abdominal pain.  All other systems reviewed and are negative.   Physical Exam Updated Vital Signs BP (!) 138/96 (BP Location: Left Arm)   Pulse (!) 107   Temp 97.7 F (36.5 C) (Oral)   Resp 16   Ht 5' 8 (1.727 m)   Wt 62.1 kg   SpO2 99%   BMI 20.83 kg/m  Physical Exam Vitals and nursing note reviewed.   Constitutional:      General: She is not in acute distress.    Appearance: She is well-developed. She is not ill-appearing.  HENT:     Head: Normocephalic and atraumatic.  Eyes:     Conjunctiva/sclera: Conjunctivae normal.  Cardiovascular:     Rate and Rhythm: Normal rate and regular rhythm.     Heart sounds: No murmur heard. Pulmonary:     Effort: Pulmonary effort is normal. No respiratory distress.     Breath sounds: Normal breath sounds.  Abdominal:     Palpations: Abdomen is soft.     Tenderness: There is abdominal tenderness in the right lower quadrant. There is right CVA tenderness and guarding. Positive signs include psoas sign.  Musculoskeletal:        General: No swelling.     Cervical back: Neck supple.  Skin:    General: Skin is warm and dry.     Capillary Refill: Capillary refill takes less than 2 seconds.  Neurological:     Mental Status: She is alert.  Psychiatric:        Mood and Affect: Mood normal.     ED Results / Procedures / Treatments   Labs (all labs ordered are listed, but only abnormal results are displayed) Labs Reviewed  COMPREHENSIVE METABOLIC PANEL - Abnormal; Notable for the following components:  Result Value   CO2 21 (*)    Albumin 5.2 (*)    AST 47 (*)    All other components within normal limits  CBC - Abnormal; Notable for the following components:   WBC 18.6 (*)    Platelets 449 (*)    All other components within normal limits  URINALYSIS, ROUTINE W REFLEX MICROSCOPIC - Abnormal; Notable for the following components:   Color, Urine COLORLESS (*)    Specific Gravity, Urine >1.046 (*)    Hgb urine dipstick SMALL (*)    Ketones, ur 40 (*)    Protein, ur 30 (*)    Bacteria, UA RARE (*)    All other components within normal limits  LIPASE, BLOOD  PREGNANCY, URINE    EKG None  Radiology CT ABDOMEN PELVIS W CONTRAST Result Date: 06/05/2023 CLINICAL DATA:  Right lower quadrant abdominal pain since yesterday. EXAM: CT ABDOMEN  AND PELVIS WITH CONTRAST TECHNIQUE: Multidetector CT imaging of the abdomen and pelvis was performed using the standard protocol following bolus administration of intravenous contrast. RADIATION DOSE REDUCTION: This exam was performed according to the departmental dose-optimization program which includes automated exposure control, adjustment of the mA and/or kV according to patient size and/or use of iterative reconstruction technique. CONTRAST:  85mL OMNIPAQUE  IOHEXOL  300 MG/ML  SOLN COMPARISON:  None Available. FINDINGS: Lower chest: Marked pectus deformity noted. The lungs are clear. No pleural effusions. Hepatobiliary: No focal liver abnormality is seen. No gallstones, gallbladder wall thickening, or biliary dilatation. Pancreas: Unremarkable. No pancreatic ductal dilatation or surrounding inflammatory changes. Spleen: Normal in size without focal abnormality. Adrenals/Urinary Tract: Adrenal glands are unremarkable. Kidneys are normal, without renal calculi, focal lesion, or hydronephrosis. Bladder is unremarkable. Stomach/Bowel: The stomach, duodenum, small and colon are grossly normal without oral contrast. Difficult to identified the appendix. Patient has a low lying cecum deep in the pelvis and there are numerous fluid filled small bowel loops in the pelvis. Could not exclude the possibility of appendicitis and recommend correlation with clinical findings. If there is strong clinical suspicion a repeat CT scan with oral contrast may be helpful further evaluation. Vascular/Lymphatic: The aorta is normal in caliber. No dissection. The branch vessels are patent. The major venous structures are patent. No mesenteric or retroperitoneal mass or adenopathy. Small scattered lymph nodes are noted. Reproductive: The uterus and ovaries are unremarkable. Other: No free air or free fluid. Musculoskeletal: No significant bony findings. IMPRESSION: 1. Difficult to identify the appendix. Patient has a low lying cecum deep  in the pelvis and there are numerous fluid filled small bowel loops in the pelvis. Could not exclude the possibility of appendicitis and recommend correlation with clinical findings. If there is strong clinical suspicion a repeat CT scan with oral contrast may be helpful further evaluation. 2. No other significant abdominal/pelvic findings, mass lesions or adenopathy. Electronically Signed   By: MYRTIS Stammer M.D.   On: 06/05/2023 22:52    Procedures Procedures   Medications Ordered in ED Medications  iohexol  (OMNIPAQUE ) 300 MG/ML solution 100 mL (85 mLs Intravenous Contrast Given 06/05/23 2231)    ED Course/ Medical Decision Making/ A&P                                 Medical Decision Making Amount and/or Complexity of Data Reviewed Labs: ordered. Radiology: ordered.  Risk Prescription drug management.   This patient presents to the ED for concern of abdominal  pain.  Differential diagnosis includes bowel obstruction, UTI, appendicitis, urolithiasis   Lab Tests:  I Ordered, and personally interpreted labs.  The pertinent results include: CBC shows leukocytosis at 18.6, CMP without any significant abnormalities, UA without clear signs of infection but some hemoglobin and bacteria seen but no nitrates or leukocytes, ketonuria also seen, lipase unremarkable, urine pregnancy negative   Imaging Studies ordered:  I ordered imaging studies including CT abdomen pelvis with contrast, CT abdomen pelvis without contrast I independently visualized and interpreted imaging which showed CT of the pelvis with contrast unable to clearly visualize the appendix although there is some dilated small bowel loops which are fluid-filled.  Given inability to rule out appendicitis, recommendation is for repeat evaluation with oral contrast given right lower quadrant tenderness.  CT abdomen pelvis without contrast pending at time of signout. I agree with the radiologist interpretation   Problem List / ED  Course:  Patient without significant history presents the emergency department concerns of abdominal pain.  She reports that her abdominal pain notably worsened yesterday but abdomen present for a few days prior.  States that the abdominal pain had been intermittent in nature but has become consistent since yesterday.  Denies any urinary symptoms such as dysuria, hematuria, increased urinary frequency or urgency.  She denies any recent change in the bowel habits.  No vomiting. On exam, there is notable right lower quadrant tenderness with a positive psoas sign.  Concern at this time for possible appendicitis.  Labs ordered from triage include a CBC with leukocytosis at 18.6.  Will proceed with CT imaging to attempt to rule out appendicitis.  CMP is otherwise unremarkable, lipase negative, urine pregnancy negative, and UA without evidence of clear infection.  Patient reports the pain is rather mild at this time and is a 3 or 4 out of 10 does not feel that she needs any medications.  Advised her to make us  aware if she feels the need for any medications if her pain were to acutely worsen. CT abdomen pelvis is unfortunately unable to clearly distinguish the appendix to rule out appendicitis.  There is some dilation of the small bowel loops in this area that are fluid-filled.  Recommendation is for oral contrast with CT abdomen pelvis to further attempt characterize possible appendicitis given area of right lower quadrant pain. Discussed this finding with patient and her mother and they would prefer to proceed with a repeat CT scan to try to further evaluate for possible appendicitis.  Advised that this is my recommendation given patient's leukocytosis and focal right lower quadrant pain.  Patient still does not feel that she needs any medications for pain at this time.  Otherwise currently not meeting sepsis criteria as patient is afebrile, normotensive, not tachycardic or tachypneic.  12:12AM Care of [facility] [place] transferred to Dr. Geroldine at the end of my shift as the patient will require reassessment once labs/imaging have resulted. Patient presentation, ED course, and plan of care discussed with review of all pertinent labs and imaging. Please see his/her note for further details regarding further ED course and disposition. Plan at time of handoff is await repeat CTAP for dispo. Concern for appendicitis given RLQ pain and leukocytosis. This may be altered or completely changed at the discretion of the oncoming team pending results of further workup.   Final Clinical Impression(s) / ED Diagnoses Final diagnoses:  None    Rx / DC Orders ED Discharge Orders     None  Renn Stille A, PA-C 06/06/23 COR[place]IN[place]    Geroldine Berg, MD 06/06/23 [phone]

## 2023-06-06 ENCOUNTER — Telehealth (HOSPITAL_BASED_OUTPATIENT_CLINIC_OR_DEPARTMENT_OTHER): Payer: Self-pay | Admitting: Emergency Medicine

## 2023-06-06 ENCOUNTER — Ambulatory Visit (HOSPITAL_BASED_OUTPATIENT_CLINIC_OR_DEPARTMENT_OTHER)
Admission: RE | Admit: 2023-06-06 | Discharge: 2023-06-06 | Disposition: A | Discharge status: Home / Self Care | Payer: BC Managed Care – PPO | Source: Ambulatory Visit | Attending: Emergency Medicine | Admitting: Emergency Medicine

## 2023-06-06 ENCOUNTER — Emergency Department (HOSPITAL_BASED_OUTPATIENT_CLINIC_OR_DEPARTMENT_OTHER): Payer: BC Managed Care – PPO

## 2023-06-06 DIAGNOSIS — R1031 Right lower quadrant pain: Secondary | ICD-10-CM

## 2023-06-06 MED ORDER — KETOROLAC TROMETHAMINE 10 MG PO TABS: 10.0000 mg | ORAL_TABLET | Freq: Four times a day (QID) | ORAL | 0 refills | Status: DC | PRN

## 2023-06-06 MED ORDER — HYDROCODONE-ACETAMINOPHEN 5-325 MG PO TABS: 1.0000 | ORAL_TABLET | Freq: Four times a day (QID) | ORAL | 0 refills | Status: DC | PRN

## 2023-06-06 MED ORDER — ONDANSETRON HCL 4 MG/2ML IJ SOLN
4.0000 mg | INTRAMUSCULAR | Status: DC | PRN
  Administered 2023-06-06: 4 mg via INTRAVENOUS
  Filled 2023-06-06: qty 2

## 2023-06-06 MED ORDER — HYDROCODONE-ACETAMINOPHEN 5-325 MG PO TABS
2.0000 | ORAL_TABLET | Freq: Once | ORAL | Status: AC
  Administered 2023-06-06: 2 via ORAL
  Filled 2023-06-06: qty 2

## 2023-06-06 MED ORDER — MORPHINE SULFATE (PF) 4 MG/ML IV SOLN
4.0000 mg | INTRAVENOUS | Status: AC | PRN
  Administered 2023-06-06: 4 mg via INTRAVENOUS
  Filled 2023-06-06: qty 1

## 2023-06-06 MED ORDER — METHOCARBAMOL 500 MG PO TABS: 500.0000 mg | ORAL_TABLET | Freq: Two times a day (BID) | ORAL | 0 refills | Status: DC | PRN

## 2023-06-06 NOTE — Discharge Instructions (Addendum)
 Take ibuprofen  600 mg every 6 hours as needed for pain.  Begin taking hydrocodone  as prescribed as needed for pain not relieved with ibuprofen .  Return tomorrow at the given time for an ultrasound of your ovary.  Follow-up with primary doctor if not improving in the next few days, and return to the ER if you develop worsening pain, high fever, bloody stool, or for other new and concerning symptoms.

## 2023-06-06 NOTE — Telephone Encounter (Cosign Needed)
 Patient being seen in the emergency department for right lower abdominal pain.  Had CT imaging as well as pelvic ultrasound which were negative.  Requesting anti-inflammatory.  Was sent home with Norco but would prefer to not take it if it anti-inflammatory manage his symptoms.

## 2023-06-07 ENCOUNTER — Encounter: Payer: Self-pay | Admitting: Nurse Practitioner

## 2023-06-11 NOTE — Telephone Encounter (Signed)
 Pt has a 06/20/23 with Claris Gower for a hosp f/up.

## 2023-06-14 ENCOUNTER — Inpatient Hospital Stay: Payer: BC Managed Care – PPO | Admitting: Nurse Practitioner

## 2023-06-20 ENCOUNTER — Inpatient Hospital Stay: Payer: BC Managed Care – PPO | Admitting: Nurse Practitioner

## 2023-06-20 ENCOUNTER — Telehealth: Payer: Self-pay

## 2023-06-20 NOTE — Telephone Encounter (Signed)
See 06/20/23 TE,

## 2023-06-20 NOTE — Transitions of Care (Post Inpatient/ED Visit) (Unsigned)
   06/20/2023  Name: Katrina Gibbs MRN: [id number] DOB: Jan 14, 2001  Today's TOC FU Call Status: Today's TOC FU Call Status:: Unsuccessful Call (1st Attempt) Unsuccessful Call (1st Attempt) Date: 06/20/23  Attempted to reach the patient regarding the most recent Inpatient/ED visit.  Follow Up Plan: Additional outreach attempts will be made to reach the patient to complete the Transitions of Care (Post Inpatient/ED visit) call.   Signature Arvil Persons, BSN, Charity fundraiser

## 2023-06-21 NOTE — Transitions of Care (Post Inpatient/ED Visit) (Deleted)
   06/21/2023  Name: Katrina Gibbs MRN: [id number] DOB: April 03, 2001  Today's TOC FU Call Status: Today's TOC FU Call Status:: Successful TOC FU Call Completed Unsuccessful Call (1st Attempt) Date: 06/20/23 [facility] FU Call Complete Date: 06/21/23  Attempted to reach the patient regarding the most recent Inpatient/ED visit.  Follow Up Plan: Additional outreach attempts will be made to reach the patient to complete the Transitions of Care (Post Inpatient/ED visit) call.   Signature AGABLE

## 2023-06-21 NOTE — Transitions of Care (Post Inpatient/ED Visit) (Signed)
   06/21/2023  Name: ATHENE HOLBERT MRN: [id number] DOB: [birth year]  Today's TOC FU Call Status: Today's TOC FU Call Status:: Successful TOC FU Call Completed Unsuccessful Call (1st Attempt) Date: 06/20/23 [facility] FU Call Complete Date: 06/21/23 Patient's Name and Date of Birth confirmed.  Transition Care Management Follow-up Telephone Call Date of Discharge: 06/07/23 Discharge Facility: [facility] Type of Discharge: Emergency Department Reason for ED Visit: Other: (abdominal pain) How have you been since you were released from the hospital?: Better Any questions or concerns?: Yes Patient Questions/Concerns:: CT scan and blood work concerns Patient Questions/Concerns Addressed: Provided Patient Educational Materials  Items Reviewed: Did you receive and understand the discharge instructions provided?: Yes Medications obtained,verified, and reconciled?: Yes (Medications Reviewed) Any new allergies since your discharge?: No Dietary orders reviewed?: No Do you have support at home?: No  Medications Reviewed Today: Medications Reviewed Today     Reviewed by Leroy Kennedy, CMA (Certified Medical Assistant) on 06/21/23 at 1344  Med List Status: <None>   Medication Order Taking? Sig Documenting Provider Last Dose Status Informant  benzonatate (TESSALON) 100 MG capsule [id number] Yes Take 1 capsule (100 mg total) by mouth 2 (two) times daily as needed for cough. Garnette Gunner, MD Taking Active   clindamycin (CLINDAGEL) 1 % gel [id number] Yes  [provider] Taking Active   fluticasone (FLONASE) 50 MCG/ACT nasal spray [id number] Yes Place 2 sprays into both nostrils daily. Garnette Gunner, MD Taking Active   HYDROcodone-acetaminophen [facility]) 5-325 MG tablet [id number] Yes Take 1-2 tablets by mouth every 6 (six) hours as needed. Geoffery Lyons, MD Taking Active   ketorolac (TORADOL) 10 MG tablet [id number] Yes Take 1 tablet (10 mg total) by mouth every 6 (six) hours  as needed. Peter Garter, PA Taking Active   methocarbamol (ROBAXIN) 500 MG tablet [id number] Yes Take 1 tablet (500 mg total) by mouth 2 (two) times daily as needed for muscle spasms. Peter Garter, [place] Taking Active   spironolactone (ALDACTONE) 25 MG tablet [id number] Yes Take 25 mg by mouth daily. [provider] Taking Active   tretinoin (RETIN-A) 0.1 % cream [id number] Yes Apply topically at bedtime. [provider] Taking Active             Home Care and Equipment/Supplies: Were Home Health Services Ordered?: No Any new equipment or medical supplies ordered?: No  Functional Questionnaire: Do you need assistance with bathing/showering or dressing?: No Do you need assistance with meal preparation?: No Do you need assistance with eating?: No Do you have difficulty maintaining continence: No Do you need assistance with getting out of bed/getting out of a chair/moving?: No Do you have difficulty managing or taking your medications?: No  Follow up appointments reviewed: PCP Follow-up appointment confirmed?: Yes Date of PCP follow-up appointment?: 06/29/23 Follow-up Provider: Claris Gower NP Specialist [facility] Follow-up appointment confirmed?: No Reason Specialist Follow-Up Not Confirmed: Appointment Sceduled by [facility] Calling Clinician Do you need transportation to your follow-up appointment?: No Do you understand care options if your condition(s) worsen?: Yes-patient verbalized understanding    SIGNATURE Gaige Sebo RMA

## 2023-06-29 ENCOUNTER — Ambulatory Visit: Payer: BC Managed Care – PPO | Admitting: Nurse Practitioner

## 2023-06-29 ENCOUNTER — Encounter: Payer: Self-pay | Admitting: Nurse Practitioner

## 2023-06-29 DIAGNOSIS — D72829 Elevated white blood cell count, unspecified: Secondary | ICD-10-CM

## 2023-06-29 DIAGNOSIS — J0140 Acute pansinusitis, unspecified: Secondary | ICD-10-CM

## 2023-06-29 LAB — CBC WITH DIFFERENTIAL/PLATELET
Basophils Absolute: 0.1 10*3/uL (ref 0.0–0.1)
Basophils Relative: 0.5 % (ref 0.0–3.0)
Eosinophils Absolute: 0.3 10*3/uL (ref 0.0–0.7)
Eosinophils Relative: 2.4 % (ref 0.0–5.0)
HCT: 41.5 % (ref 36.0–46.0)
Hemoglobin: 14 g/dL (ref 12.0–15.0)
Lymphocytes Relative: 19.5 % (ref 12.0–46.0)
Lymphs Abs: 2.2 10*3/uL (ref 0.7–4.0)
MCHC: 33.8 g/dL (ref 30.0–36.0)
MCV: 93.3 fL (ref 78.0–100.0)
Monocytes Absolute: 0.9 10*3/uL (ref 0.1–1.0)
Monocytes Relative: 8.1 % (ref 3.0–12.0)
Neutro Abs: 7.7 10*3/uL (ref 1.4–7.7)
Neutrophils Relative %: 69.5 % (ref 43.0–77.0)
Platelets: 419 10*3/uL — ABNORMAL HIGH (ref 150.0–400.0)
RBC: 4.45 Mil/uL (ref 3.87–5.11)
RDW: 12.9 % (ref 11.5–15.5)
WBC: 11.1 10*3/uL — ABNORMAL HIGH (ref 4.0–10.5)

## 2023-06-29 MED ORDER — AMOXICILLIN-POT CLAVULANATE 875-125 MG PO TABS: 1.0000 | ORAL_TABLET | Freq: Two times a day (BID) | ORAL | 0 refills | Status: DC

## 2023-06-29 MED ORDER — BENZONATATE 100 MG PO CAPS: 100.0000 mg | ORAL_CAPSULE | Freq: Two times a day (BID) | ORAL | 0 refills | Status: DC | PRN

## 2023-06-29 NOTE — Progress Notes (Signed)
Established Patient Visit  Patient: Katrina Gibbs   DOB: [birth year]   23 y.o. Female  MRN: [id number] Visit Date: 06/29/2023  Subjective:    Chief Complaint  Patient presents with   Hospitalization Follow-up    Follow up for abdominal pain; symptoms have improved; she is experiencing cough for 3 weeks, mucinex DM and Tessalon used.    HPI Acute sinusitis Sent augmentin x 7days Use claritin or zyrtec or allegra 1tab daily for postnasal congestion and sinus congestion. Stop oral decongestant Ms. Sabia reports resolved RLQ Abd pain since ED visit on 06/05/2023. We reviewed lab results and radiology reports. Need to repeat CBC due to elevated WBC and platelet count Today she also reports Cough and sinus congestion x 2weeks. Symptoms had improved, then returned despite use of mucinex D.  Reviewed medical, surgical, and social history today  Medications: Outpatient Medications Prior to Visit  Medication Sig   spironolactone (ALDACTONE) 25 MG tablet Take 25 mg by mouth daily.   tretinoin (RETIN-A) 0.1 % cream Apply topically at bedtime.   clindamycin (CLINDAGEL) 1 % gel  (Patient not taking: Reported on 06/29/2023)   fluticasone (FLONASE) 50 MCG/ACT nasal spray Place 2 sprays into both nostrils daily. (Patient not taking: Reported on 06/29/2023)   HYDROcodone-acetaminophen (NORCO) 5-325 MG tablet Take 1-2 tablets by mouth every 6 (six) hours as needed. (Patient not taking: Reported on 06/29/2023)   ketorolac (TORADOL) 10 MG tablet Take 1 tablet (10 mg total) by mouth every 6 (six) hours as needed. (Patient not taking: Reported on 06/29/2023)   methocarbamol (ROBAXIN) 500 MG tablet Take 1 tablet (500 mg total) by mouth 2 (two) times daily as needed for muscle spasms. (Patient not taking: Reported on 06/29/2023)   [DISCONTINUED] benzonatate (TESSALON) 100 MG capsule Take 1 capsule (100 mg total) by mouth 2 (two) times daily as needed for cough. (Patient not taking: Reported on  06/29/2023)   No facility-administered medications prior to visit.   Reviewed past medical and social history.   ROS per HPI above  Last CBC Lab Results  Component Value Date   WBC 18.6 (H) 06/05/2023   HGB 14.2 06/05/2023   HCT 40.0 06/05/2023   MCV 89.5 06/05/2023   MCH 31.8 06/05/2023   RDW 12.6 06/05/2023   PLT 449 (H) 06/05/2023   Last metabolic panel Lab Results  Component Value Date   GLUCOSE 92 06/05/2023   NA 135 06/05/2023   K 3.6 06/05/2023   CL 101 06/05/2023   CO2 21 (L) 06/05/2023   BUN 9 06/05/2023   CREATININE 0.61 06/05/2023   GFRNONAA >60 06/05/2023   CALCIUM 9.4 06/05/2023   PROT 8.1 06/05/2023   ALBUMIN 5.2 (H) 06/05/2023   BILITOT 0.5 06/05/2023   ALKPHOS 52 06/05/2023   AST 47 (H) 06/05/2023   ALT 21 06/05/2023   ANIONGAP 13 06/05/2023        Objective:  BP 119/77 (BP Location: Left Arm, Patient Position: Sitting, Cuff Size: Normal)   Pulse (!) 120   Temp (!) 97 F (36.1 C) (Temporal)   Resp 18   Wt 139 lb 3.2 oz (63.1 kg)   LMP 06/14/2023 (Exact Date)   SpO2 99%   BMI 21.17 kg/m      Physical Exam Vitals and nursing note reviewed.  HENT:     Right Ear: Tympanic membrane, ear canal and external ear normal.     Left Ear:  Tympanic membrane, ear canal and external ear normal.     Nose:     Right Nostril: No occlusion.     Left Nostril: No occlusion.     Right Turbinates: Swollen.     Left Turbinates: Swollen.     Right Sinus: Maxillary sinus tenderness and frontal sinus tenderness present.     Left Sinus: Maxillary sinus tenderness and frontal sinus tenderness present.     Mouth/Throat:     Pharynx: Posterior oropharyngeal erythema and postnasal drip present. No pharyngeal swelling, oropharyngeal exudate or uvula swelling.     Tonsils: No tonsillar exudate.  Cardiovascular:     Rate and Rhythm: Normal rate and regular rhythm.     Pulses: Normal pulses.     Heart sounds: Normal heart sounds.  Pulmonary:     Effort: Pulmonary  effort is normal.     Breath sounds: Normal breath sounds.  Abdominal:     General: Bowel sounds are normal. There is no distension.     Palpations: Abdomen is soft.     Tenderness: There is no abdominal tenderness.  Neurological:     Mental Status: She is alert and oriented to person, place, and time.     No results found for any visits on 06/29/23.    Assessment & Plan:    Problem List Items Addressed This Visit     Acute sinusitis   Sent augmentin x 7days Use claritin or zyrtec or allegra 1tab daily for postnasal congestion and sinus congestion. Stop oral decongestant      Relevant Medications   benzonatate (TESSALON) 100 MG capsule   amoxicillin-clavulanate (AUGMENTIN) 875-125 MG tablet   Other Visit Diagnoses       Leukocytosis, unspecified type       Relevant Orders   CBC with Differential/Platelet      Return in about 3 months (around 09/26/2023) for CPE (fasting).     Alysia Penna, NP

## 2023-06-29 NOTE — Patient Instructions (Signed)
Go to lab Use claritin or zyrtec or allegra 1tab daily for postnasal congestion and sinus congestion.

## 2023-06-29 NOTE — Assessment & Plan Note (Signed)
Sent augmentin x 7days Use claritin or zyrtec or allegra 1tab daily for postnasal congestion and sinus congestion. Stop oral decongestant

## 2023-09-28 ENCOUNTER — Ambulatory Visit (INDEPENDENT_AMBULATORY_CARE_PROVIDER_SITE_OTHER): Payer: BC Managed Care – PPO | Admitting: Nurse Practitioner

## 2023-09-28 ENCOUNTER — Encounter: Payer: Self-pay | Admitting: Nurse Practitioner

## 2023-09-28 VITALS — BP 104/80 | HR 88 | Temp 98.2°F | Ht 68.5 in | Wt 136.0 lb

## 2023-09-28 DIAGNOSIS — G43109 Migraine with aura, not intractable, without status migrainosus: Secondary | ICD-10-CM | POA: Diagnosis not present

## 2023-09-28 DIAGNOSIS — Z0001 Encounter for general adult medical examination with abnormal findings: Secondary | ICD-10-CM

## 2023-09-28 DIAGNOSIS — D72829 Elevated white blood cell count, unspecified: Secondary | ICD-10-CM | POA: Insufficient documentation

## 2023-09-28 DIAGNOSIS — F17200 Nicotine dependence, unspecified, uncomplicated: Secondary | ICD-10-CM | POA: Diagnosis not present

## 2023-09-28 MED ORDER — NICOTINE 21 MG/24HR TD PT24: 21.0000 mg | MEDICATED_PATCH | Freq: Every day | TRANSDERMAL | 0 refills | Status: DC

## 2023-09-28 NOTE — Assessment & Plan Note (Addendum)
 Onset at age 23 50mg /ml cartridge per week. Never quit Unknown trigger Wakes up in middle of night to use vape.  Ready to quit. 21mg  Nicoderm patch sent Advised to also use peppermint candy for oral gratification. She is to call office for additional 17mg  Nicoderm patch in 28days

## 2023-09-28 NOTE — Patient Instructions (Signed)
 Schedule fasting lab appt. Need to be fasting 8hrs prior to blood draw. Ok to drink water. Maintain Heart healthy diet and daily exercise.

## 2023-09-28 NOTE — Progress Notes (Signed)
 Complete physical exam  Patient: Katrina Gibbs   DOB: 01-Jan-2001   22 y.o. Female  MRN: [id number] Visit Date: 09/28/2023  Subjective:    Chief Complaint  Patient presents with   Annual Exam    Not fasting    Katrina Gibbs is a 23 y.o. female who presents today for a complete physical exam. She reports consuming a general diet.  No exercise regimen  She generally feels well. She reports sleeping well. She does have additional problems to discuss today.  Vision:Yes Dental:Yes STD Screen:No  BP Readings from Last 3 Encounters:  09/28/23 104/80  06/29/23 119/77  06/06/23 118/82   Wt Readings from Last 3 Encounters:  09/28/23 136 lb (61.7 kg)  06/29/23 139 lb 3.2 oz (63.1 kg)  06/05/23 137 lb (62.1 kg)   Most recent fall risk assessment:    09/28/2023    9:58 AM  Fall Risk   Falls in the past year? 0  Number falls in past yr: 0  Injury with Fall? 0  Risk for fall due to : No Fall Risks   Depression screen:Yes - No Depression Most recent depression screenings:    09/28/2023    9:59 AM 06/29/2023   11:11 AM  PHQ 2/9 Scores  PHQ - 2 Score 0 0  PHQ- 9 Score 3     HPI  Migraines Controlled by avoiding triggers: chocolate, peanut, banana, red-40 food dye, caffeine Associated to aura-tunnel vision, vomiting with headache. Typically last for 4hrs Resolves with ibuprofen  and rest Fhx of migraine-mother and sister  Vaping nicotine dependence, non-tobacco product Onset at age 57 50mg /ml cartridge per week. Never quit Unknown trigger Wakes up in middle of night to use vape.  Ready to quit. 21mg  Nicoderm patch sent Advised to also use peppermint candy for oral gratification. She is to call office for additional 17mg  Nicoderm patch in 28days   Past Medical History:  Diagnosis Date   Acute sinusitis 07/17/2022   Constipation    Preventative health care 02/07/2016   Past Surgical History:  Procedure Laterality Date   WISDOM TOOTH EXTRACTION     Social History    Socioeconomic History   Marital status: Single    Spouse name: Not on file   Number of children: 0   Years of education: 9   Highest education level: Some college, no degree  Occupational History   Occupation: Consulting civil engineer  Tobacco Use   Smoking status: Never   Smokeless tobacco: Never  Vaping Use   Vaping status: Every Day   Start date: 12/22/2016   Substances: Nicotine, Flavoring  Substance and Sexual Activity   Alcohol use: Yes    Alcohol/week: 1.0 standard drink of alcohol    Types: 1 Glasses of wine per week   Drug use: No   Sexual activity: Not Currently    Birth control/protection: Abstinence  Other Topics Concern   Not on file  Social History Narrative      Social Drivers of Health   Financial Resource Strain: High Risk (06/28/2023)   Overall Financial Resource Strain (CARDIA)    Difficulty of Paying Living Expenses: Very hard  Food Insecurity: No Food Insecurity (06/28/2023)   Hunger Vital Sign    Worried About Running Out of Food in the Last Year: Never true    Ran Out of Food in the Last Year: Never true  Transportation Needs: No Transportation Needs (06/28/2023)   PRAPARE - Administrator, Civil Service (Medical): No  Lack of Transportation (Non-Medical): No  Physical Activity: Sufficiently Active (06/28/2023)   Exercise Vital Sign    Days of Exercise per Week: 4 days    Minutes of Exercise per Session: 60 min  Stress: No Stress Concern Present (06/28/2023)   [employer] of Occupational Health - Occupational Stress Questionnaire    Feeling of Stress : Only a little  Social Connections: Moderately Integrated (06/28/2023)   Social Connection and Isolation Panel [NHANES]    Frequency of Communication with Friends and Family: More than three times a week    Frequency of Social Gatherings with Friends and Family: More than three times a week    Attends Religious Services: More than 4 times per year    Active Member of Clubs or Organizations: Yes     Attends Banker Meetings: 1 to 4 times per year    Marital Status: Never married  Catering manager Violence: Not on file   Family Status  Relation Name Status   Mother  Alive   Father  Alive   Sister  Alive   MGM  Alive   MGF  Deceased   PGM  Alive   PGF  Deceased   Neg Hx  (Not Specified)  No partnership data on file   Family History  Problem Relation Age of Onset   Diabetes Father 30   Hyperlipidemia Father    Hypertension Father    Kidney Stones Father    Heart disease Maternal Grandmother    Hypertension Maternal Grandmother    Diabetes Maternal Grandmother    Stroke Maternal Grandfather    Hyperlipidemia Paternal Grandmother    Stroke Paternal Grandfather    Hypertension Paternal Grandfather    Irritable bowel syndrome Neg Hx    Celiac disease Neg Hx    Colitis Neg Hx    Crohn's disease Neg Hx    GER disease Neg Hx    Allergies  Allergen Reactions   Dextromethorphan Itching    Patient Care Team: Alberto Pina, Connye Delaine, NP as PCP - General (Internal Medicine) Mir, Loyola Rummage, MD as Consulting Physician (Pediatric Gastroenterology) Ob/Gyn, Alexander Iba, MD as Consulting Physician (Obstetrics and Gynecology)   Medications: Outpatient Medications Prior to Visit  Medication Sig   spironolactone (ALDACTONE) 25 MG tablet Take 25 mg by mouth daily.   tretinoin (RETIN-A) 0.1 % cream Apply topically at bedtime.   [DISCONTINUED] amoxicillin -clavulanate (AUGMENTIN ) 875-125 MG tablet Take 1 tablet by mouth 2 (two) times daily. (Patient not taking: Reported on 09/28/2023)   [DISCONTINUED] benzonatate  (TESSALON ) 100 MG capsule Take 1 capsule (100 mg total) by mouth 2 (two) times daily as needed for cough. (Patient not taking: Reported on 09/28/2023)   [DISCONTINUED] clindamycin (CLINDAGEL) 1 % gel  (Patient not taking: Reported on 09/28/2023)   [DISCONTINUED] fluticasone  (FLONASE ) 50 MCG/ACT nasal spray Place 2 sprays into both nostrils daily.  (Patient not taking: Reported on 09/28/2023)   [DISCONTINUED] HYDROcodone -acetaminophen  (NORCO) 5-325 MG tablet Take 1-2 tablets by mouth every 6 (six) hours as needed. (Patient not taking: Reported on 09/28/2023)   [DISCONTINUED] ketorolac  (TORADOL ) 10 MG tablet Take 1 tablet (10 mg total) by mouth every 6 (six) hours as needed. (Patient not taking: Reported on 09/28/2023)   [DISCONTINUED] methocarbamol  (ROBAXIN ) 500 MG tablet Take 1 tablet (500 mg total) by mouth 2 (two) times daily as needed for muscle spasms. (Patient not taking: Reported on 09/28/2023)   No facility-administered medications prior to visit.    Review of Systems  Constitutional:  Negative for activity change, appetite change and unexpected weight change.  Respiratory: Negative.    Cardiovascular: Negative.   Gastrointestinal: Negative.   Endocrine: Negative for cold intolerance and heat intolerance.  Genitourinary: Negative.   Musculoskeletal: Negative.   Skin: Negative.   Neurological: Negative.   Hematological: Negative.   Psychiatric/Behavioral:  Negative for behavioral problems, decreased concentration, dysphoric mood, hallucinations, self-injury, sleep disturbance and suicidal ideas. The patient is not nervous/anxious.        Objective:  BP 104/80   Pulse 88   Temp 98.2 F (36.8 C) (Temporal)   Ht 5' 8.5" (1.74 m)   Wt 136 lb (61.7 kg)   LMP 09/13/2023   SpO2 100%   BMI 20.38 kg/m     Physical Exam Vitals and nursing note reviewed.  Constitutional:      General: She is not in acute distress. HENT:     Right Ear: Tympanic membrane, ear canal and external ear normal.     Left Ear: Tympanic membrane, ear canal and external ear normal.     Nose: Nose normal.  Eyes:     Extraocular Movements: Extraocular movements intact.     Conjunctiva/sclera: Conjunctivae normal.     Pupils: Pupils are equal, round, and reactive to light.  Neck:     Thyroid : No thyroid  mass, thyromegaly or thyroid  tenderness.   Cardiovascular:     Rate and Rhythm: Normal rate and regular rhythm.     Pulses: Normal pulses.     Heart sounds: Normal heart sounds.  Pulmonary:     Effort: Pulmonary effort is normal.     Breath sounds: Normal breath sounds.  Abdominal:     General: Bowel sounds are normal.     Palpations: Abdomen is soft.  Musculoskeletal:        General: Normal range of motion.     Cervical back: Normal range of motion and neck supple.     Right lower leg: No edema.     Left lower leg: No edema.  Lymphadenopathy:     Cervical: No cervical adenopathy.  Skin:    General: Skin is warm and dry.  Neurological:     Mental Status: She is alert and oriented to person, place, and time.     Cranial Nerves: No cranial nerve deficit.  Psychiatric:        Mood and Affect: Mood normal.        Behavior: Behavior normal.        Thought Content: Thought content normal.      No results found for any visits on 09/28/23.    Assessment & Plan:    Routine Health Maintenance and Physical Exam  Immunization History  Administered Date(s) Administered   DTaP 12/26/2000, 02/20/2001, 04/23/2001, 05/14/2002, 10/31/2005   Dtap, Unspecified 12/26/2000, 02/20/2001, 04/23/2001, 05/14/2002, 10/31/2005   HIB (PRP-OMP) 12/26/2000, 02/20/2001, 05/14/2002   HIB, Unspecified 12/26/2000, 02/20/2001, 05/14/2002   Hep B, Unspecified Oct 05, 2000, 11/26/2000, 11/01/2001   Hepatitis B Oct 09, 2000, 11/26/2000, 11/01/2001   Hepatitis B, ADULT 01/12/2021, 02/16/2021, 07/21/2021   IPV 01/23/2001, 04/23/2001, 04/08/2002, 10/31/2005   MMR 04/08/2002, 11/22/2005   Polio, Unspecified 01/23/2001, 04/23/2001, 04/08/2002, 10/31/2005   Tdap 12/05/2011   Varicella 11/01/2001, 11/22/2005, 12/24/2020   Health Maintenance  Topic Date Due   Meningococcal B Vaccine (1 of 2 - Standard) Never done   COVID-19 Vaccine (1 - 2024-25 season) 10/14/2023 (Originally 01/28/2023)   HPV VACCINES (1 - 3-dose series) 06/28/2024 (Originally 10/28/2015)    CHLAMYDIA SCREENING  09/27/2024 (Originally  10/28/2015)   Hepatitis C Screening  09/27/2024 (Originally 10/28/2018)   HIV Screening  09/27/2024 (Originally 10/28/2015)   INFLUENZA VACCINE  12/28/2023   Cervical Cancer Screening (Pap smear)  11/16/2024   DTaP/Tdap/Td  Discontinued   Discussed health benefits of physical activity, and encouraged her to engage in regular exercise appropriate for her age and condition.  Problem List Items Addressed This Visit     Migraines - Primary   Controlled by avoiding triggers: chocolate, peanut, banana, red-40 food dye, caffeine Associated to aura-tunnel vision, vomiting with headache. Typically last for 4hrs Resolves with ibuprofen  and rest Fhx of migraine-mother and sister      Vaping nicotine dependence, non-tobacco product   Onset at age 82 50mg /ml cartridge per week. Never quit Unknown trigger Wakes up in middle of night to use vape.  Ready to quit. 21mg  Nicoderm patch sent Advised to also use peppermint candy for oral gratification. She is to call office for additional 17mg  Nicoderm patch in 28days      Relevant Medications   nicotine (NICODERM CQ) 21 mg/24hr patch   Other Visit Diagnoses       Encounter for preventative adult health care exam with abnormal findings       Relevant Orders   Hepatic function panel     Leukocytosis, unspecified type       Relevant Orders   CBC with Differential/Platelet      Return in about 1 year (around 09/27/2024) for CPE (fasting).     Kathrene Parents, NP

## 2023-09-28 NOTE — Assessment & Plan Note (Signed)
 Repeat cbc     Latest Ref Rng & Units 06/29/2023   11:50 AM 06/05/2023    6:16 PM 04/18/2022    8:57 AM  CBC  WBC 4.0 - 10.5 K/uL 11.1  18.6  11.6   Hemoglobin 12.0 - 15.0 g/dL 16.1  09.6  04.5   Hematocrit 36.0 - 46.0 % 41.5  40.0  41.3   Platelets 150.0 - 400.0 K/uL 419.0  449  413.0

## 2023-09-28 NOTE — Assessment & Plan Note (Signed)
 Controlled by avoiding triggers: chocolate, peanut, banana, red-40 food dye, caffeine Associated to aura-tunnel vision, vomiting with headache. Typically last for 4hrs Resolves with ibuprofen  and rest Fhx of migraine-mother and sister

## 2023-10-05 ENCOUNTER — Other Ambulatory Visit (INDEPENDENT_AMBULATORY_CARE_PROVIDER_SITE_OTHER)

## 2023-10-05 DIAGNOSIS — Z0001 Encounter for general adult medical examination with abnormal findings: Secondary | ICD-10-CM

## 2023-10-05 DIAGNOSIS — D72829 Elevated white blood cell count, unspecified: Secondary | ICD-10-CM

## 2023-10-05 LAB — CBC WITH DIFFERENTIAL/PLATELET
Basophils Absolute: 0.1 10*3/uL (ref 0.0–0.1)
Basophils Relative: 0.5 % (ref 0.0–3.0)
Eosinophils Absolute: 0.1 10*3/uL (ref 0.0–0.7)
Eosinophils Relative: 0.7 % (ref 0.0–5.0)
HCT: 38.6 % (ref 36.0–46.0)
Hemoglobin: 13.1 g/dL (ref 12.0–15.0)
Lymphocytes Relative: 18.7 % (ref 12.0–46.0)
Lymphs Abs: 2.1 10*3/uL (ref 0.7–4.0)
MCHC: 34 g/dL (ref 30.0–36.0)
MCV: 94 fl (ref 78.0–100.0)
Monocytes Absolute: 0.6 10*3/uL (ref 0.1–1.0)
Monocytes Relative: 5 % (ref 3.0–12.0)
Neutro Abs: 8.3 10*3/uL — ABNORMAL HIGH (ref 1.4–7.7)
Neutrophils Relative %: 75.1 % (ref 43.0–77.0)
Platelets: 376 10*3/uL (ref 150.0–400.0)
RBC: 4.1 Mil/uL (ref 3.87–5.11)
RDW: 13 % (ref 11.5–15.5)
WBC: 11.1 10*3/uL — ABNORMAL HIGH (ref 4.0–10.5)

## 2023-10-05 LAB — HEPATIC FUNCTION PANEL
ALT: 11 U/L (ref 0–35)
AST: 15 U/L (ref 0–37)
Albumin: 4.4 g/dL (ref 3.5–5.2)
Alkaline Phosphatase: 44 U/L (ref 39–117)
Bilirubin, Direct: 0.1 mg/dL (ref 0.0–0.3)
Total Bilirubin: 0.3 mg/dL (ref 0.2–1.2)
Total Protein: 6.8 g/dL (ref 6.0–8.3)

## 2023-10-06 ENCOUNTER — Encounter: Payer: Self-pay | Admitting: Nurse Practitioner

## 2023-11-20 DIAGNOSIS — Z118 Encounter for screening for other infectious and parasitic diseases: Secondary | ICD-10-CM | POA: Diagnosis not present

## 2023-11-20 DIAGNOSIS — Z113 Encounter for screening for infections with a predominantly sexual mode of transmission: Secondary | ICD-10-CM | POA: Diagnosis not present

## 2023-12-17 ENCOUNTER — Ambulatory Visit (INDEPENDENT_AMBULATORY_CARE_PROVIDER_SITE_OTHER): Admitting: Nurse Practitioner

## 2023-12-17 ENCOUNTER — Encounter: Payer: Self-pay | Admitting: Nurse Practitioner

## 2023-12-17 VITALS — BP 116/68 | HR 88 | Temp 98.9°F | Ht 68.5 in | Wt 136.6 lb

## 2023-12-17 DIAGNOSIS — J029 Acute pharyngitis, unspecified: Secondary | ICD-10-CM

## 2023-12-17 DIAGNOSIS — F17200 Nicotine dependence, unspecified, uncomplicated: Secondary | ICD-10-CM | POA: Diagnosis not present

## 2023-12-17 DIAGNOSIS — R051 Acute cough: Secondary | ICD-10-CM

## 2023-12-17 LAB — POCT RAPID STREP A (OFFICE): Rapid Strep A Screen: NEGATIVE

## 2023-12-17 LAB — POCT MONO (EPSTEIN BARR VIRUS): Mono, POC: NEGATIVE

## 2023-12-17 LAB — POC COVID19 BINAXNOW: SARS Coronavirus 2 Ag: NEGATIVE

## 2023-12-17 NOTE — Assessment & Plan Note (Signed)
 Quit 2weeks ago with the use of nicoderm. Has not needed nicoderm x 3days Denies any withdrawal sign effects Advised to use nicorette gum 3mg  prn to manage cravings

## 2023-12-17 NOTE — Progress Notes (Signed)
 Acute Office Visit  Subjective:    Patient ID: Katrina Gibbs, female    DOB: 26-Mar-2001, 23 y.o.   MRN: [id number]  Chief Complaint  Patient presents with   Sore Throat    Reoccurring sore throat seen 2x by telehealth given Antibiotics has whit spots back of throat     Sore Throat    Patient is in today for recurrent sore throat x 4weeks, no fever,no rash. Sore throat is resolved but has persistent redness. She developed non productive cough 2days ago She was treated for possible strep infection twice in last 32month. Took amoxicillin  875mg  BIDx 1week and azithromycin 250mg  daily x 5days. Completed on 12/05/2023. Today no SOB or chest pain or rash or swollen lymph nodes  Outpatient Medications Prior to Visit  Medication Sig   nicotine  (NICODERM CQ ) 21 mg/24hr patch Place 1 patch (21 mg total) onto the skin daily. (Patient taking differently: Place 21 mg onto the skin as needed.)   spironolactone (ALDACTONE) 25 MG tablet Take 25 mg by mouth daily.   tretinoin (RETIN-A) 0.1 % cream Apply topically at bedtime.   No facility-administered medications prior to visit.    Reviewed past medical and social history.  Review of Systems Per HPI     Objective:    Physical Exam Vitals reviewed.  HENT:     Mouth/Throat:     Tonsils: No tonsillar exudate. 1+ on the right. 1+ on the left.  Pulmonary:     Effort: Pulmonary effort is normal.     Breath sounds: Normal breath sounds.  Musculoskeletal:     Cervical back: Normal range of motion and neck supple.  Lymphadenopathy:     Cervical: No cervical adenopathy.  Skin:    General: Skin is warm and dry.     Findings: No rash.  Neurological:     Mental Status: She is alert.    BP 116/68 (BP Location: Left Arm, Patient Position: Sitting, Cuff Size: Normal)   Pulse 88   Temp 98.9 F (37.2 C) (Oral)   Ht 5' 8.5 (1.74 m)   Wt 136 lb 9.6 oz (62 kg)   LMP 11/04/2023   SpO2 99%   BMI 20.47 kg/m    Results for orders placed or  performed in visit on 12/17/23  POCT rapid strep A  Result Value Ref Range   Rapid Strep A Screen Negative Negative  POCT Mono (Epstein Barr Virus)  Result Value Ref Range   Mono, POC Negative Negative  POC COVID-19  Result Value Ref Range   SARS Coronavirus 2 Ag Negative Negative      Assessment & Plan:   Problem List Items Addressed This Visit     Vaping nicotine  dependence, non-tobacco product   Quit 2weeks ago with the use of nicoderm. Has not needed nicoderm x 3days Denies any withdrawal sign effects Advised to use nicorette gum 3mg  prn to manage cravings      Other Visit Diagnoses       Pharyngitis, unspecified etiology    -  Primary   Relevant Orders   POCT rapid strep A (Completed)   POCT Mono (Epstein Barr Virus) (Completed)   POC COVID-19 (Completed)   GC/CT Probe, Amp (Throat)     Acute cough       Relevant Orders   POC COVID-19 (Completed)     Negative COVID, strep, Mono Pending throat culture. Consider ENT referral  Encourage adequate oral hydration. Use warm salt water gaggle or lozenges to soothe  throat. Use mucinex DM or Robitussin  or delsym for cough without sinus congestion  You can use plain Tylenol  or Advil  for fever, chills and achyness. Use cool mist humidifier at bedtime to help with nasal congestion and cough.  Cold/cough medications may have tylenol  or ibuprofen  or guaifenesin or dextromethophan in them, so be careful not to take beyond the recommended dose for each of these medications.  No orders of the defined types were placed in this encounter.  Return if symptoms worsen or fail to improve.  Roselie Mood, NP

## 2023-12-17 NOTE — Patient Instructions (Addendum)
 Negative COVID, strep, Mono Pending throat culture.  Encourage adequate oral hydration. Use warm salt water gaggle or lozenges to soothe throat. Use mucinex DM or Robitussin  or delsym for cough without sinus congestion  You can use plain Tylenol  or Advil  for fever, chills and achyness. Use cool mist humidifier at bedtime to help with nasal congestion and cough.  Cold/cough medications may have tylenol  or ibuprofen  or guaifenesin or dextromethophan in them, so be careful not to take beyond the recommended dose for each of these medications.   Common cold symptoms are usually triggered by a virus.  The antibiotics are usually not necessary. On average, a viral cold illness may take 7-10 days to resolve. Please, make an appointment if you are not better or if you're worse.

## 2023-12-19 ENCOUNTER — Ambulatory Visit: Payer: Self-pay | Admitting: Nurse Practitioner

## 2023-12-20 LAB — GC/CHLAMYDIA PROBE, AMP (THROAT)

## 2023-12-20 LAB — TIQ-NTM

## 2023-12-21 DIAGNOSIS — J029 Acute pharyngitis, unspecified: Secondary | ICD-10-CM | POA: Diagnosis not present

## 2023-12-21 NOTE — Addendum Note (Signed)
 Addended by: ALTO PARODY D on: 12/21/2023 04:02 PM   Modules accepted: Orders

## 2023-12-24 LAB — GC/CHLAMYDIA PROBE, AMP (THROAT)
Chlamydia trachomatis RNA: NOT DETECTED
Neisseria gonorrhoeae RNA: NOT DETECTED

## 2023-12-24 LAB — TIQ-NTM

## 2024-01-29 ENCOUNTER — Other Ambulatory Visit: Payer: Self-pay | Admitting: Nurse Practitioner

## 2024-01-29 DIAGNOSIS — F17200 Nicotine dependence, unspecified, uncomplicated: Secondary | ICD-10-CM

## 2024-02-06 NOTE — Addendum Note (Signed)
 Addended by: LENON ROUGHEN on: 02/06/2024 08:31 AM   Modules accepted: Orders

## 2024-02-07 MED ORDER — NICOTINE 21 MG/24HR TD PT24: 21.0000 mg | MEDICATED_PATCH | Freq: Every day | TRANSDERMAL | 0 refills | Status: DC

## 2024-02-07 NOTE — Addendum Note (Signed)
 Addended by: KATHEEN GARDEN L on: 02/07/2024 02:50 PM   Modules accepted: Orders

## 2024-02-26 DIAGNOSIS — L814 Other melanin hyperpigmentation: Secondary | ICD-10-CM | POA: Diagnosis not present

## 2024-02-26 DIAGNOSIS — L578 Other skin changes due to chronic exposure to nonionizing radiation: Secondary | ICD-10-CM | POA: Diagnosis not present

## 2024-02-26 DIAGNOSIS — L700 Acne vulgaris: Secondary | ICD-10-CM | POA: Diagnosis not present

## 2024-02-26 DIAGNOSIS — L821 Other seborrheic keratosis: Secondary | ICD-10-CM | POA: Diagnosis not present

## 2024-04-15 DIAGNOSIS — L700 Acne vulgaris: Secondary | ICD-10-CM | POA: Diagnosis not present

## 2024-04-15 DIAGNOSIS — L718 Other rosacea: Secondary | ICD-10-CM | POA: Diagnosis not present

## 2024-05-05 DIAGNOSIS — N911 Secondary amenorrhea: Secondary | ICD-10-CM | POA: Diagnosis not present

## 2024-05-13 DIAGNOSIS — Z3A08 8 weeks gestation of pregnancy: Secondary | ICD-10-CM | POA: Diagnosis not present

## 2024-05-13 DIAGNOSIS — Z3481 Encounter for supervision of other normal pregnancy, first trimester: Secondary | ICD-10-CM | POA: Diagnosis not present

## 2024-05-13 DIAGNOSIS — Z3401 Encounter for supervision of normal first pregnancy, first trimester: Secondary | ICD-10-CM | POA: Diagnosis not present

## 2024-05-27 DIAGNOSIS — Z3A10 10 weeks gestation of pregnancy: Secondary | ICD-10-CM | POA: Diagnosis not present

## 2024-05-27 DIAGNOSIS — Z3481 Encounter for supervision of other normal pregnancy, first trimester: Secondary | ICD-10-CM | POA: Diagnosis not present

## 2024-06-21 ENCOUNTER — Inpatient Hospital Stay (HOSPITAL_COMMUNITY)

## 2024-06-21 ENCOUNTER — Inpatient Hospital Stay (HOSPITAL_COMMUNITY)
Admission: AD | Admit: 2024-06-21 | Discharge: 2024-06-21 | Disposition: A | Discharge status: Home / Self Care | Attending: Obstetrics and Gynecology | Admitting: Obstetrics and Gynecology

## 2024-06-21 ENCOUNTER — Encounter (HOSPITAL_COMMUNITY): Payer: Self-pay | Admitting: *Deleted

## 2024-06-21 DIAGNOSIS — O4692 Antepartum hemorrhage, unspecified, second trimester: Secondary | ICD-10-CM | POA: Insufficient documentation

## 2024-06-21 DIAGNOSIS — O468X2 Other antepartum hemorrhage, second trimester: Secondary | ICD-10-CM

## 2024-06-21 DIAGNOSIS — B9689 Other specified bacterial agents as the cause of diseases classified elsewhere: Secondary | ICD-10-CM

## 2024-06-21 DIAGNOSIS — Z3A14 14 weeks gestation of pregnancy: Secondary | ICD-10-CM | POA: Diagnosis not present

## 2024-06-21 LAB — URINALYSIS, ROUTINE W REFLEX MICROSCOPIC
Bilirubin Urine: NEGATIVE
Glucose, UA: NEGATIVE mg/dL
Ketones, ur: NEGATIVE mg/dL
Leukocytes,Ua: NEGATIVE
Nitrite: NEGATIVE
Protein, ur: NEGATIVE mg/dL
Specific Gravity, Urine: 1.023 (ref 1.005–1.030)
pH: 6 (ref 5.0–8.0)

## 2024-06-21 LAB — WET PREP, GENITAL
Sperm: NONE SEEN
Trich, Wet Prep: NONE SEEN
WBC, Wet Prep HPF POC: <10
Yeast Wet Prep HPF POC: NONE SEEN

## 2024-06-21 MED ORDER — METRONIDAZOLE 500 MG PO TABS: 500.0000 mg | ORAL_TABLET | Freq: Two times a day (BID) | ORAL | 0 refills | Status: AC

## 2024-06-21 NOTE — MAU Provider Note (Signed)
 " History     CSN: [id number]  Arrival date and time: 06/21/24 0019   Event Date/Time   First Provider Initiated Contact with Patient 06/21/24 0257      Chief Complaint  Patient presents with   Vaginal Bleeding   Katrina Gibbs is a 24 y.o. G1P0 at [redacted]w[redacted]d by US  who receives care at [facility].  She reports next appt Jul 02, 2024.  She presents today for vaginal bleeding.  She states around 2315 she went to use the bathroom and noted brown blood in her underwear.  She denies cramping, but reports white discharge today that was new.  No issues with urination, constipation, or diarrhea.   OB History     Gravida  1   Para      Term      Preterm      AB      Living         SAB      IAB      Ectopic      Multiple      Live Births              Past Medical History:  Diagnosis Date   Acute sinusitis 07/17/2022   Constipation    Preventative health care 02/07/2016    Past Surgical History:  Procedure Laterality Date   WISDOM TOOTH EXTRACTION      Family History  Problem Relation Age of Onset   Diabetes Father 18   Hyperlipidemia Father    Hypertension Father    Kidney Stones Father    Heart disease Maternal Grandmother    Hypertension Maternal Grandmother    Diabetes Maternal Grandmother    Stroke Maternal Grandfather    Hyperlipidemia Paternal Grandmother    Stroke Paternal Grandfather    Hypertension Paternal Grandfather    Irritable bowel syndrome Neg Hx    Celiac disease Neg Hx    Colitis Neg Hx    Crohn's disease Neg Hx    GER disease Neg Hx     Social History[1]  Allergies: Allergies[2]  Medications Prior to Admission  Medication Sig Dispense Refill Last Dose/Taking   Prenatal Vit-Fe Fumarate-FA (PRENATAL MULTIVITAMIN) TABS tablet Take 1 tablet by mouth daily at 12 noon.   06/20/2024   nicotine  (NICODERM CQ ) 21 mg/24hr patch Place 1 patch (21 mg total) onto the skin daily. 28 patch 0 More than a month   spironolactone  (ALDACTONE) 25 MG tablet Take 25 mg by mouth daily.   More than a month   tretinoin (RETIN-A) 0.1 % cream Apply topically at bedtime.   More than a month    Review of Systems  Gastrointestinal:  Negative for abdominal pain, constipation, diarrhea, nausea and vomiting.  Genitourinary:  Positive for vaginal bleeding and vaginal discharge. Negative for difficulty urinating and dysuria.   Physical Exam   Blood pressure 122/81, pulse (!) 108, temperature 98.2 F (36.8 C), temperature source Oral, resp. rate 12, height 5' 8 (1.727 m), weight 71.2 kg, last menstrual period 11/04/2023.  Physical Exam Vitals and nursing note reviewed.  Constitutional:      Appearance: Normal appearance.  HENT:     Head: Normocephalic and atraumatic.  Eyes:     Conjunctiva/sclera: Conjunctivae normal.  Cardiovascular:     Rate and Rhythm: Normal rate.  Musculoskeletal:        General: Normal range of motion.     Cervical back: Normal range of motion.  Skin:  General: Skin is warm and dry.  Neurological:     Mental Status: She is alert and oriented to person, place, and time.  Psychiatric:        Mood and Affect: Mood normal.        Behavior: Behavior normal.     MAU Course  Procedures Results for orders placed or performed during the hospital encounter of 06/21/24 (from the past 24 hours)  Wet prep, genital     Status: Abnormal   Collection Time: 06/21/24 12:57 AM  Result Value Ref Range   Yeast Wet Prep HPF POC NONE SEEN NONE SEEN   Trich, Wet Prep NONE SEEN NONE SEEN   Clue Cells Wet Prep HPF POC PRESENT (A) NONE SEEN   WBC, Wet Prep HPF POC <10 <10   Sperm NONE SEEN   Urinalysis, Routine w reflex microscopic -Urine, Clean Catch     Status: Abnormal   Collection Time: 06/21/24 12:58 AM  Result Value Ref Range   Color, Urine YELLOW YELLOW   APPearance HAZY (A) CLEAR   Specific Gravity, Urine 1.023 1.005 - 1.030   pH 6.0 5.0 - 8.0   Glucose, UA NEGATIVE NEGATIVE mg/dL   Hgb urine  dipstick LARGE (A) NEGATIVE   Bilirubin Urine NEGATIVE NEGATIVE   Ketones, ur NEGATIVE NEGATIVE mg/dL   Protein, ur NEGATIVE NEGATIVE mg/dL   Nitrite NEGATIVE NEGATIVE   Leukocytes,Ua NEGATIVE NEGATIVE   RBC / HPF 0-5 0 - 5 RBC/hpf   WBC, UA 0-5 0 - 5 WBC/hpf   Bacteria, UA FEW (A) NONE SEEN   Squamous Epithelial / HPF 0-5 0 - 5 /HPF   Mucus PRESENT     MDM Physical Exam Cultures: Wet Prep and GC/CT Labs: UA Ultrasound Prescription Assessment and Plan  24 year old, G1P0  SIUP at 14.1 weeks [facility] Bacterial Vaginosis  -Orders placed while patient in triage. -Results as above. -Reviewed findings with patient.  -Informed that US  is preliminary report only and final will be ready once read by MFM MD.  -Educated on [facility], what to expect including bleeding, risks for miscarriage, and resolution.  -Instructed to avoid sexual activity while bleeding and for 72 hours after resolution.  -Informed of clue cells c/w BV. Discussed treatment. Patient opts for oral medication. -Rx sent to pharmacy on file.  -Precautions reviewed.  -Encouraged to call primary office or return to MAU if symptoms worsen or with the onset of new symptoms. -Discharged to home in stable condition.  Harlene LITTIE Duncans MSN, CNM Advanced Practice Provider, [facility] 06/21/2024, 2:57 AM      [1]  Social History Tobacco Use   Smoking status: Never   Smokeless tobacco: Never  Vaping Use   Vaping status: Every Day   Start date: 12/22/2016   Substances: Nicotine , Flavoring  Substance Use Topics   Alcohol use: Yes    Alcohol/week: 1.0 standard drink of alcohol    Types: 1 Glasses of wine per week   Drug use: No  [2]  Allergies Allergen Reactions   Dextromethorphan Itching   "

## 2024-06-21 NOTE — MAU Note (Addendum)
 [facility] - [facility]-  In office - has had labs and U/S - all ok . Then tonight at 2315 - to b-room- underwear had brown blood . SABRA Pad on - in Triage - 1 dot of brown . No pain - no cramps .   FHR- 152 Had UTI in Dec- finished antx

## 2024-06-22 LAB — CULTURE, OB URINE

## 2024-06-23 LAB — GC/CHLAMYDIA PROBE AMP (~~LOC~~) NOT AT ARMC
Chlamydia: NEGATIVE
Comment: NEGATIVE
Comment: NORMAL
Neisseria Gonorrhea: NEGATIVE

## 2024-09-29 ENCOUNTER — Encounter: Admitting: Nurse Practitioner
